# Patient Record
Sex: Female | Born: 1960 | Race: White | Hispanic: No | Smoking: Former smoker
Health system: Southern US, Community
[De-identification: ages and names within clinical notes are randomized; demographics above are authoritative.]

## PROBLEM LIST (undated history)

## (undated) DIAGNOSIS — E785 Hyperlipidemia, unspecified: Secondary | ICD-10-CM

## (undated) DIAGNOSIS — E039 Hypothyroidism, unspecified: Secondary | ICD-10-CM

## (undated) DIAGNOSIS — E669 Obesity, unspecified: Secondary | ICD-10-CM

## (undated) HISTORY — DX: Obesity, unspecified: E66.9

## (undated) HISTORY — DX: Hyperlipidemia, unspecified: E78.5

## (undated) HISTORY — PX: OTHER SURGICAL HISTORY: SHX169

## (undated) HISTORY — PX: APPENDECTOMY: SHX54

## (undated) HISTORY — PX: ABDOMINAL HYSTERECTOMY: SHX81

## (undated) HISTORY — DX: Hypothyroidism, unspecified: E03.9

## (undated) HISTORY — PX: ESOPHAGEAL DILATION: SHX303

## (undated) HISTORY — PX: GANGLION CYST EXCISION: SHX1691

---

## 2005-10-14 ENCOUNTER — Ambulatory Visit: Payer: Self-pay | Admitting: Unknown Physician Specialty

## 2007-08-09 ENCOUNTER — Ambulatory Visit: Payer: Self-pay | Admitting: Unknown Physician Specialty

## 2008-11-22 ENCOUNTER — Ambulatory Visit: Payer: Self-pay | Admitting: Unknown Physician Specialty

## 2010-08-18 ENCOUNTER — Ambulatory Visit: Payer: Self-pay | Admitting: Internal Medicine

## 2010-11-03 ENCOUNTER — Ambulatory Visit (INDEPENDENT_AMBULATORY_CARE_PROVIDER_SITE_OTHER): Payer: Self-pay | Admitting: Internal Medicine

## 2010-11-03 ENCOUNTER — Encounter: Payer: Self-pay | Admitting: Internal Medicine

## 2010-11-03 VITALS — BP 116/80 | HR 84 | Temp 98.1°F | Resp 16 | Ht 62.0 in | Wt 181.0 lb

## 2010-11-03 DIAGNOSIS — F411 Generalized anxiety disorder: Secondary | ICD-10-CM

## 2010-11-03 DIAGNOSIS — J40 Bronchitis, not specified as acute or chronic: Secondary | ICD-10-CM

## 2010-11-03 DIAGNOSIS — E039 Hypothyroidism, unspecified: Secondary | ICD-10-CM | POA: Insufficient documentation

## 2010-11-03 DIAGNOSIS — E663 Overweight: Secondary | ICD-10-CM | POA: Insufficient documentation

## 2010-11-03 DIAGNOSIS — F419 Anxiety disorder, unspecified: Secondary | ICD-10-CM

## 2010-11-03 DIAGNOSIS — F17200 Nicotine dependence, unspecified, uncomplicated: Secondary | ICD-10-CM

## 2010-11-03 MED ORDER — PREDNISONE (PAK) 10 MG PO TABS: 10.0000 mg | ORAL_TABLET | Freq: Every day | ORAL | Status: AC

## 2010-11-03 MED ORDER — AZITHROMYCIN 250 MG PO TABS: ORAL_TABLET | ORAL | Status: AC

## 2010-11-03 NOTE — Progress Notes (Signed)
  Subjective:    Patient ID: Wendy Clarke, female    DOB: Nov 10, 1960, 50 y.o.   MRN: [id number]  URI  This is a new problem. The current episode started in the past 7 days. The problem has been gradually worsening. There has been no fever. Associated symptoms include chest pain, congestion, coughing, a sore throat and wheezing. Pertinent negatives include no abdominal pain, diarrhea, ear pain, headaches, joint pain, joint swelling, nausea, neck pain, plugged ear sensation, rash, rhinorrhea, sinus pain, sneezing or swollen glands. She has tried decongestant for the symptoms. The treatment provided no relief.      Review of Systems  Constitutional: Negative for fever, chills and unexpected weight change.  HENT: Positive for congestion and sore throat. Negative for hearing loss, ear pain, nosebleeds, facial swelling, rhinorrhea, sneezing, mouth sores, trouble swallowing, neck pain, neck stiffness, voice change, postnasal drip, sinus pressure, tinnitus and ear discharge.   Eyes: Negative for pain, discharge, redness and visual disturbance.  Respiratory: Positive for cough and wheezing. Negative for chest tightness, shortness of breath and stridor.   Cardiovascular: Positive for chest pain. Negative for palpitations and leg swelling.  Gastrointestinal: Negative for nausea, abdominal pain and diarrhea.  Musculoskeletal: Negative for myalgias, joint pain and arthralgias.  Skin: Negative for color change and rash.  Neurological: Negative for dizziness, weakness, light-headedness and headaches.  Hematological: Negative for adenopathy.       Objective:   Physical Exam  Constitutional: She is oriented to person, place, and time. She appears well-developed and well-nourished. No distress.  HENT:  Head: Normocephalic and atraumatic.  Right Ear: External ear normal.  Left Ear: External ear normal.  Nose: Nose normal.  Mouth/Throat: Oropharynx is clear and moist. No oropharyngeal exudate.  Eyes:  Conjunctivae and EOM are normal. Pupils are equal, round, and reactive to light. Right eye exhibits no discharge. Left eye exhibits no discharge. No scleral icterus.  Neck: Normal range of motion. Neck supple. No tracheal deviation present. No thyromegaly present.  Cardiovascular: Normal rate and regular rhythm.  Exam reveals gallop. Exam reveals no friction rub.   No murmur heard. Pulmonary/Chest: Effort normal. No stridor. No respiratory distress. She has no wheezes. She has no rales. She exhibits no tenderness.       Prolonged expiratory phase noted  Musculoskeletal: Normal range of motion. She exhibits no edema.  Lymphadenopathy:    She has no cervical adenopathy.  Neurological: She is alert and oriented to person, place, and time. Coordination normal.  Skin: Skin is warm and dry. No rash noted. She is not diaphoretic. No erythema. No pallor.  Psychiatric: She has a normal mood and affect. Her behavior is normal. Judgment and thought content normal.          Assessment:    Acute Bronchitis    Plan:    Discussed diagnosis, its evaluation, treatment and usual course. All questions answered. Agricultural engineer distributed. Steroid burst per orders. Antibiotics per orders. Strongly encouraged smoking cessation.

## 2010-11-03 NOTE — Patient Instructions (Signed)
Smoking Cessation This document explains the best ways for you to quit smoking and new treatments to help. It lists new medicines that can double or triple your chances of quitting and quitting for good. It also considers ways to avoid relapses and concerns you may have about quitting, including weight gain. NICOTINE: A POWERFUL ADDICTION If you have tried to quit smoking, you know how hard it can be. It is hard because nicotine is a very addictive drug. For some people, it can be as addictive as heroin or cocaine. Usually, people make 2 or 3 tries, or more, before finally being able to quit. Each time you try to quit, you can learn about what helps and what hurts. Quitting takes hard work and a lot of effort, but you can quit smoking. QUITTING SMOKING IS ONE OF THE MOST IMPORTANT THINGS YOU WILL EVER DO:  You will live longer, feel better, and live better.   The impact on your body of quitting smoking is felt almost immediately:   Within 20 minutes, blood pressure decreases. Pulse returns to its normal level.   After 8 hours, carbon monoxide levels in the blood return to normal. Oxygen level increases.   After 24 hours, chance of heart attack starts to decrease. Breath, hair, and body stop smelling like smoke.   After 48 hours, damaged nerve endings begin to recover. Sense of taste and smell improve.   After 72 hours, the body is virtually free of nicotine. Bronchial tubes relax and breathing becomes easier.   After 2 to 12 weeks, lungs can hold more air. Exercise becomes easier and circulation improves.   Quitting will lower your chance of having a heart attack, stroke, cancer, or lung disease:   After 1 year, the risk of coronary heart disease is cut in half.   After 5 years, the risk of stroke falls to the same as a nonsmoker.   After 10 years, the risk of lung cancer is cut in half and the risk of other cancers decreases significantly.   After 15 years, the risk of coronary heart  disease drops, usually to the level of a nonsmoker.   If you are pregnant, quitting smoking will improve your chances of having a healthy baby.   The people you live with, especially your children, will be healthier.   You will have extra money to spend on things other than cigarettes.  FIVE KEYS TO QUITTING Studies have shown that these 5 steps will help you quit smoking and quit for good. You have the best chances of quitting if you use them together: 1. Get ready.  2. Get support and encouragement.  3. Learn new skills and behaviors.  4. Get medicine to reduce your nicotine addiction and use it correctly.  5. Be prepared for relapse or difficult situations. Be determined to continue trying to quit, even if you do not succeed at first.  1. GET READY  Set a quit date.   Change your environment.   Get rid of ALL cigarettes, ashtrays, matches, and lighters in your home, car, and place of work.   Do not let people smoke in your home.   Review your past attempts to quit. Think about what worked and what did not.   Once you quit, do not smoke. NOT EVEN A PUFF!  2. GET SUPPORT AND ENCOURAGEMENT Studies have shown that you have a better chance of being successful if you have help. You can get support in many ways.  Tell  your family, friends, and coworkers that you are going to quit and need their support. Ask them not to smoke around you.   Talk to your caregivers (doctor, dentist, nurse, pharmacist, psychologist, and/or smoking counselor).   Get individual, group, or telephone counseling and support. The more counseling you have, the better your chances are of quitting. Programs are available at local hospitals and health centers. Call your local health department for information about programs in your area.   Spiritual beliefs and practices may help some smokers quit.   Quit meters are small computer programs online or downloadable that keep track of quit statistics, such as amount  of "quit-time," cigarettes not smoked, and money saved.   Many smokers find one or more of the many self-help books available useful in helping them quit and stay off tobacco.  3. LEARN NEW SKILLS AND BEHAVIORS  Try to distract yourself from urges to smoke. Talk to someone, go for a walk, or occupy your time with a task.   When you first try to quit, change your routine. Take a different route to work. Drink tea instead of coffee. Eat breakfast in a different place.   Do something to reduce your stress. Take a hot bath, exercise, or read a book.   Plan something enjoyable to do every day. Reward yourself for not smoking.   Explore interactive web-based programs that specialize in helping you quit.  4. GET MEDICINE AND USE IT CORRECTLY Medicines can help you stop smoking and decrease the urge to smoke. Combining medicine with the above behavioral methods and support can quadruple your chances of successfully quitting smoking. The U.[place]. Food and Drug Administration (FDA) has approved 7 medicines to help you quit smoking. These medicines fall into 3 categories.  Nicotine replacement therapy (delivers nicotine to your body without the negative effects and risks of smoking):   Nicotine gum: Available over-the-counter.   Nicotine lozenges: Available over-the-counter.   Nicotine inhaler: Available by prescription.   Nicotine nasal spray: Available by prescription.   Nicotine skin patches (transdermal): Available by prescription and over-the-counter.   Antidepressant medicine (helps people abstain from smoking, but how this works is unknown):   Bupropion sustained-release (SR) tablets: Available by prescription.   Nicotinic receptor partial agonist (simulates the effect of nicotine in your brain):   Varenicline tartrate tablets: Available by prescription.   Ask your caregiver for advice about which medicines to use and how to use them. Carefully read the information on the package.    Everyone who is trying to quit may benefit from using a medicine. If you are pregnant or trying to become pregnant, nursing an infant, you are under age 18, or you smoke fewer than 10 cigarettes per day, talk to your caregiver before taking any nicotine replacement medicines.   You should stop using a nicotine replacement product and call your caregiver if you experience nausea, dizziness, weakness, vomiting, fast or irregular heartbeat, mouth problems with the lozenge or gum, or redness or swelling of the skin around the patch that does not go away.   Do not use any other product containing nicotine while using a nicotine replacement product.   Talk to your caregiver before using these products if you have diabetes, heart disease, asthma, stomach ulcers, you had a recent heart attack, you have high blood pressure that is not controlled with medicine, a history of irregular heartbeat, or you have been prescribed medicine to help you quit smoking.  5. BE PREPARED FOR RELAPSE OR   DIFFICULT SITUATIONS  Most relapses occur within the first 3 months after quitting. Do not be discouraged if you start smoking again. Remember, most people try several times before they finally quit.   You may have symptoms of withdrawal because your body is used to nicotine. You may crave cigarettes, be irritable, feel very hungry, cough often, get headaches, or have difficulty concentrating.   The withdrawal symptoms are only temporary. They are strongest when you first quit, but they will go away within 10 to 14 days.  Here are some difficult situations to watch for:  Alcohol. Avoid drinking alcohol. Drinking lowers your chances of successfully quitting.   Caffeine. Try to reduce the amount of caffeine you consume. It also lowers your chances of successfully quitting.   Other smokers. Being around smoking can make you want to smoke. Avoid smokers.   Weight gain. Many smokers will gain weight when they quit, usually  less than 10 pounds. Eat a healthy diet and stay active. Do not let weight gain distract you from your main goal, quitting smoking. Some medicines that help you quit smoking may also help delay weight gain. You can always lose the weight gained after you quit.   Bad mood or depression. There are a lot of ways to improve your mood other than smoking.  If you are having problems with any of these situations, talk to your caregiver. SPECIAL SITUATIONS OR CONDITIONS Studies suggest that everyone can quit smoking. Your situation or condition can give you a special reason to quit.  Pregnant women/New mothers: By quitting, you protect your baby's health and your own.   Hospitalized patients: By quitting, you reduce health problems and help healing.   Heart attack patients: By quitting, you reduce your risk of a second heart attack.   Lung, head, and neck cancer patients: By quitting, you reduce your chance of a second cancer.   Parents of children and adolescents: By quitting, you protect your children from illnesses caused by secondhand smoke.  QUESTIONS TO THINK ABOUT Think about the following questions before you try to stop smoking. You may want to talk about your answers with your caregiver.  Why do you want to quit?   If you tried to quit in the past, what helped and what did not?   What will be the most difficult situations for you after you quit? How will you plan to handle them?   Who can help you through the tough times? Your family? Friends? Caregiver?   What pleasures do you get from smoking? What ways can you still get pleasure if you quit?  Here are some questions to ask your caregiver:  How can you help me to be successful at quitting?   What medicine do you think would be best for me and how should I take it?   What should I do if I need more help?   What is smoking withdrawal like? How can I get information on withdrawal?  Quitting takes hard work and a lot of effort,  but you can quit smoking. FOR MORE INFORMATION Smokefree.gov (http://www.davis-sullivan.com/) provides free, accurate, evidence-based information and professional assistance to help support the immediate and long-term needs of people trying to quit smoking. Document Released: 02/24/2001 Document Re-Released: 08/20/2009 [facility] Patient Information 2011 [place], [place].Bronchitis Bronchitis is the body's way of reacting to injury and/or infection (inflammation) of the bronchi. Bronchi are the air tubes that extend from the windpipe into the lungs. If the inflammation becomes severe, it may  cause shortness of breath.  CAUSES Inflammation may be caused by:  A virus.   Germs (bacteria).   Dust.   Allergens.   Pollutants and many other irritants.  The cells lining the bronchial tree are covered with tiny hairs (cilia). These constantly beat upward, away from the lungs, toward the mouth. This keeps the lungs free of pollutants. When these cells become too irritated and are unable to do their job, mucus begins to develop. This causes the characteristic cough of bronchitis. The cough clears the lungs when the cilia are unable to do their job. Without either of these protective mechanisms, the mucus would settle in the lungs. Then you would develop pneumonia. Smoking is a common cause of bronchitis and can contribute to pneumonia. Stopping this habit is the single most important thing you can do to help yourself. TREATMENT  Your caregiver may prescribe an antibiotic if the cough is caused by bacteria. Also, medicines that open up your airways make it easier to breathe. Your caregiver may also recommend or prescribe an expectorant. It will loosen the mucus to be coughed up. Only take over-the-counter or prescription medicines for pain, discomfort, or fever as directed by your caregiver.   Removing whatever causes the problem (smoking, for example) is critical to preventing the problem from getting worse.     Cough suppressants may be prescribed for relief of cough symptoms.   Inhaled medicines may be prescribed to help with symptoms now and to help prevent problems from returning.   For those with recurrent (chronic) bronchitis, there may be a need for steroid medicines.  SEEK IMMEDIATE MEDICAL CARE IF:  During treatment, you develop more pus-like mucus (purulent sputum).   You or your child has an oral temperature above 101.62F, not controlled by medicine.  You become progressively more ill.   You have increased difficulty breathing, wheezing, or shortness of breath.  It is necessary to seek immediate medical care if you are elderly or sick from any other disease. MAKE SURE YOU:  Understand these instructions.   Will watch your condition.   Will get help right away if you are not doing well or get worse.  Document Released: 03/02/2005 Document Re-Released: 05/27/2009 [facility] Patient Information 2011 [place], [place].

## 2010-11-04 ENCOUNTER — Encounter: Payer: Self-pay | Admitting: Internal Medicine

## 2011-03-03 ENCOUNTER — Telehealth: Payer: Self-pay | Admitting: Internal Medicine

## 2011-03-03 MED ORDER — ALPRAZOLAM 0.25 MG PO TABS: 0.2500 mg | ORAL_TABLET | Freq: Every evening | ORAL | Status: DC | PRN

## 2011-03-03 NOTE — Telephone Encounter (Signed)
Pt left message on voice mail to get refill on xanax Work # [phone] [employer]

## 2011-03-03 NOTE — Telephone Encounter (Signed)
OK to refill x 3 

## 2011-03-03 NOTE — Telephone Encounter (Signed)
Ok for RF 

## 2011-03-03 NOTE — Telephone Encounter (Signed)
Called in, # disconnected

## 2011-04-27 ENCOUNTER — Telehealth: Payer: Self-pay | Admitting: Internal Medicine

## 2011-04-27 NOTE — Telephone Encounter (Signed)
[facility] Triage Call Report Triage Record Num: [id number] Operator: Caswell Corwin Patient Name: Wendy Clarke Call Date & Time: 04/27/2011 8:30:04AM Patient Phone: [phone] PCP: Ronna Polio Patient Gender: Female PCP Fax : [phone] Patient DOB: 10/13/1960 Practice Name: [facility] Reason for Call: Caller: Wendy Clarke/Patient; PCP: Ronna Polio; CB#: [phone]; ; ; Call regarding Cough/Congestion that started 04/25/11 PM. Triaged URI and all emergent Sx R/O and home care and call back inst given. Protocol(s) Used: Upper Respiratory Infection (URI) Recommended Outcome per Protocol: See Provider within 2 Weeks Reason for Outcome: Recent or recurrent episodes of sneezing, nasal congestion; watery nasal drainage; scratchy/itchy throat; red/itchy/watery eyes or cough unrelieved after one week of home care measures Care Advice: Use an air purifier, clean or change filters on heating/cooling units at least monthly. Maintaining humidity in home below 50%, helps prevent dust mites and mold. ~ ~ Consider use of a saline nasal spray per package directions to help relieve nasal congestion. Avoid exposure to environmental irritants. Do not smoke and avoid second-hand smoke. Avoid outside activities on high pollution days. Instead of strong smelling commercial cleaning products, substitute vinegar and lemon juice. ~ ~ Call provider if symptoms become severe, or if treatment that relieved the symptoms in the past is no longer working. If not contraindicated, consider nonprescription non-sedating antihistamine for symptom relief as directed on label or by pharmacist. Other antihistamine medications may cause drowsiness or confusion, especially in elderly or chronically ill patients and should be taken with caution. ~ Do not take nonprescription decongestants (such as Actifed, Sinutab, Sudafed) if there is a history of hypertension, unless specifically directed by  provider. ~ Be aware that some nonprescription drugs contain both an antihistamine and decongestant. If taking a combination drug, do not take an additional decongestant. ~ 04/27/2011 8:46:37AM Page 1 of 1 [id number]

## 2011-05-18 ENCOUNTER — Ambulatory Visit: Payer: BC Managed Care – PPO | Admitting: Internal Medicine

## 2011-06-04 ENCOUNTER — Other Ambulatory Visit: Payer: Self-pay | Admitting: Internal Medicine

## 2011-06-04 ENCOUNTER — Encounter: Payer: Self-pay | Admitting: Internal Medicine

## 2011-06-04 ENCOUNTER — Ambulatory Visit (INDEPENDENT_AMBULATORY_CARE_PROVIDER_SITE_OTHER): Payer: BC Managed Care – PPO | Admitting: Internal Medicine

## 2011-06-04 VITALS — BP 126/72 | HR 100 | Temp 98.2°F | Resp 16 | Ht 62.0 in | Wt 181.0 lb

## 2011-06-04 DIAGNOSIS — E663 Overweight: Secondary | ICD-10-CM

## 2011-06-04 DIAGNOSIS — E039 Hypothyroidism, unspecified: Secondary | ICD-10-CM

## 2011-06-04 DIAGNOSIS — E785 Hyperlipidemia, unspecified: Secondary | ICD-10-CM

## 2011-06-04 DIAGNOSIS — R5383 Other fatigue: Secondary | ICD-10-CM | POA: Insufficient documentation

## 2011-06-04 DIAGNOSIS — F411 Generalized anxiety disorder: Secondary | ICD-10-CM

## 2011-06-04 DIAGNOSIS — R5381 Other malaise: Secondary | ICD-10-CM

## 2011-06-04 DIAGNOSIS — G4700 Insomnia, unspecified: Secondary | ICD-10-CM | POA: Insufficient documentation

## 2011-06-04 DIAGNOSIS — F419 Anxiety disorder, unspecified: Secondary | ICD-10-CM

## 2011-06-04 MED ORDER — LEVOTHYROXINE SODIUM 50 MCG PO TABS: 50.0000 ug | ORAL_TABLET | Freq: Every day | ORAL | Status: DC

## 2011-06-04 MED ORDER — ALPRAZOLAM 0.25 MG PO TABS: 0.2500 mg | ORAL_TABLET | Freq: Every evening | ORAL | Status: DC | PRN

## 2011-06-04 MED ORDER — PHENTERMINE HCL 37.5 MG PO TABS: 37.5000 mg | ORAL_TABLET | Freq: Every day | ORAL | Status: DC

## 2011-06-04 NOTE — Assessment & Plan Note (Signed)
Pt has been off synthroid x 2 weeks. Will restart today.  Check TSH with labs. Follow up 1 month.

## 2011-06-04 NOTE — Progress Notes (Signed)
Subjective:    Patient ID: Wendy Clarke, female    DOB: 1960-12-14, 51 y.o.   MRN: [id number]  HPI 51 year old female with history of hypothyroidism, obesity, and anxiety/depression presents for followup. She reports that she has recently been out of her medications. She reports significant increase in fatigue. She also notes increase in anxiety related to stress at work. She also notes significant financial stress. She notes that she has recently gained weight. She has not been exercising or following healthy diet.   She is concerned today about insomnia. She has tried taking over-the-counter medication such as Benadryl with no improvement. She does not want to take any medication that has the potential to be habit-forming. She has difficulty falling asleep and staying asleep.  Outpatient Encounter Prescriptions as of 06/04/2011  Medication Sig Dispense Refill  . levothyroxine (SYNTHROID, LEVOTHROID) 50 MCG tablet Take 1 tablet (50 mcg total) by mouth daily.  30 tablet  6  . DISCONTD: ALPRAZolam (XANAX) 0.25 MG tablet Take 1 tablet (0.25 mg total) by mouth at bedtime as needed.  30 tablet  3  . DISCONTD: levothyroxine (SYNTHROID, LEVOTHROID) 50 MCG tablet Take 50 mcg by mouth daily.        . phentermine (ADIPEX-P) 37.5 MG tablet Take 1 tablet (37.5 mg total) by mouth daily before breakfast.  30 tablet  0  . DISCONTD: Levothyroxine Sodium (SYNTHROID PO) Take 1 tablet by mouth daily.        Marland Kitchen DISCONTD: PHENTERMINE HCL PO Take 1 tablet by mouth daily.          Review of Systems  Constitutional: Positive for fatigue. Negative for fever, chills, appetite change and unexpected weight change.  HENT: Negative for ear pain, congestion, sore throat, trouble swallowing, neck pain, voice change and sinus pressure.   Eyes: Negative for visual disturbance.  Respiratory: Negative for cough, shortness of breath, wheezing and stridor.   Cardiovascular: Negative for chest pain, palpitations and leg swelling.    Gastrointestinal: Negative for nausea, abdominal pain, diarrhea, constipation and abdominal distention.  Genitourinary: Negative for dysuria and flank pain.  Musculoskeletal: Negative for myalgias, arthralgias and gait problem.  Skin: Negative for color change and rash.  Neurological: Negative for dizziness and headaches.  Hematological: Negative for adenopathy. Does not bruise/bleed easily.  Psychiatric/Behavioral: Positive for sleep disturbance and dysphoric mood. Negative for suicidal ideas. The patient is nervous/anxious.       BP 126/72  Pulse 100  Temp(Src) 98.2 F (36.8 C) (Oral)  Resp 16  Ht 5\' 2"  (1.575 m)  Wt 181 lb (82.101 kg)  BMI 33.11 kg/m2  SpO2 99%  Objective:   Physical Exam  Constitutional: She is oriented to person, place, and time. She appears well-developed and well-nourished. No distress.  HENT:  Head: Normocephalic and atraumatic.  Right Ear: External ear normal.  Left Ear: External ear normal.  Nose: Nose normal.  Mouth/Throat: Oropharynx is clear and moist. No oropharyngeal exudate.  Eyes: Conjunctivae are normal. Pupils are equal, round, and reactive to light. Right eye exhibits no discharge. Left eye exhibits no discharge. No scleral icterus.  Neck: Normal range of motion. Neck supple. No tracheal deviation present. No thyromegaly present.  Cardiovascular: Normal rate, regular rhythm, normal heart sounds and intact distal pulses.  Exam reveals no gallop and no friction rub.   No murmur heard. Pulmonary/Chest: Effort normal and breath sounds normal. No respiratory distress. She has no wheezes. She has no rales. She exhibits no tenderness.  Abdominal: Soft. Bowel sounds are  normal. She exhibits no distension. There is no tenderness.  Musculoskeletal: Normal range of motion. She exhibits no edema and no tenderness.  Lymphadenopathy:    She has no cervical adenopathy.  Neurological: She is alert and oriented to person, place, and time. No cranial nerve  deficit. She exhibits normal muscle tone. Coordination normal.  Skin: Skin is warm and dry. No rash noted. She is not diaphoretic. No erythema. No pallor.  Psychiatric: Her speech is normal and behavior is normal. Judgment and thought content normal. Her mood appears anxious. Her affect is angry. Cognition and memory are normal. She exhibits a depressed mood.          Assessment & Plan:

## 2011-06-04 NOTE — Assessment & Plan Note (Signed)
Likely multifactorial with stress at work, poor sleep, off thyroid medication.  Encouraged increased physical activity during the day.  Will check CBC, CMP, TSH.

## 2011-06-04 NOTE — Assessment & Plan Note (Signed)
BMI 33.  Will restart phentermine to help with appetite suppression as this has worked well for her in the past. Encouraged improved dietary choices and increased physical activity. Follow up 1 month.

## 2011-06-04 NOTE — Assessment & Plan Note (Signed)
Will try increasing physical activity during the day and using xanax at night prn.  Follow up in 1 month.

## 2011-06-04 NOTE — Assessment & Plan Note (Signed)
Fairly well controlled on Alprazolam. Will continue.

## 2011-06-05 LAB — LIPID PANEL
HDL: 38.6 mg/dL — ABNORMAL LOW (ref >39.00)
VLDL: 110.2 mg/dL — ABNORMAL HIGH (ref 0.0–40.0)

## 2011-06-05 LAB — CBC WITH DIFFERENTIAL/PLATELET
Basophils Relative: 0.6 % (ref 0.0–3.0)
Eosinophils Relative: 3.8 % (ref 0.0–5.0)
Hemoglobin: 14 g/dL (ref 12.0–15.0)
Lymphocytes Relative: 35.8 % (ref 12.0–46.0)
Monocytes Relative: 1.9 % — ABNORMAL LOW (ref 3.0–12.0)
Neutrophils Relative %: 57.9 % (ref 43.0–77.0)
RBC: 4.65 Mil/uL (ref 3.87–5.11)
WBC: 11.4 10*3/uL — ABNORMAL HIGH (ref 4.5–10.5)

## 2011-06-05 LAB — LDL CHOLESTEROL, DIRECT: Direct LDL: 100.1 mg/dL

## 2011-06-05 LAB — COMPREHENSIVE METABOLIC PANEL
ALT: 28 U/L (ref 0–35)
AST: 25 U/L (ref 0–37)
Chloride: 104 mEq/L (ref 96–112)
Creatinine, Ser: 0.5 mg/dL (ref 0.4–1.2)
Sodium: 138 mEq/L (ref 135–145)
Total Bilirubin: 0.4 mg/dL (ref 0.3–1.2)
Total Protein: 7.2 g/dL (ref 6.0–8.3)

## 2011-06-09 ENCOUNTER — Telehealth: Payer: Self-pay | Admitting: *Deleted

## 2011-06-09 NOTE — Telephone Encounter (Signed)
Pt's insurance requires PA on Phentermine 37.5 1 qam #30. Unsure of PA #, faxing form back to pharm.

## 2011-07-21 ENCOUNTER — Encounter: Payer: Self-pay | Admitting: Internal Medicine

## 2011-07-21 ENCOUNTER — Ambulatory Visit (INDEPENDENT_AMBULATORY_CARE_PROVIDER_SITE_OTHER): Payer: BC Managed Care – PPO | Admitting: Internal Medicine

## 2011-07-21 ENCOUNTER — Ambulatory Visit: Payer: Self-pay | Admitting: Internal Medicine

## 2011-07-21 VITALS — BP 104/62 | HR 92 | Temp 98.2°F | Resp 16 | Wt 185.0 lb

## 2011-07-21 DIAGNOSIS — R1011 Right upper quadrant pain: Secondary | ICD-10-CM | POA: Insufficient documentation

## 2011-07-21 LAB — POCT URINALYSIS DIPSTICK
Leukocytes, UA: NEGATIVE
Nitrite, UA: NEGATIVE
Protein, UA: NEGATIVE
pH, UA: 6.5

## 2011-07-21 LAB — CBC WITH DIFFERENTIAL/PLATELET
Basophils Absolute: 0.1 10*3/uL (ref 0.0–0.1)
Eosinophils Absolute: 0.4 10*3/uL (ref 0.0–0.7)
HCT: 41.8 % (ref 36.0–46.0)
Lymphs Abs: 3.2 10*3/uL (ref 0.7–4.0)
MCHC: 34.2 g/dL (ref 30.0–36.0)
MCV: 87.9 fl (ref 78.0–100.0)
Monocytes Absolute: 0.4 10*3/uL (ref 0.1–1.0)
Platelets: 198 10*3/uL (ref 150.0–400.0)
RDW: 13.7 % (ref 11.5–14.6)

## 2011-07-21 LAB — LIPASE: Lipase: 30 U/L (ref 11.0–59.0)

## 2011-07-21 LAB — COMPREHENSIVE METABOLIC PANEL
Albumin: 4 g/dL (ref 3.5–5.2)
Alkaline Phosphatase: 62 U/L (ref 39–117)
Glucose, Bld: 102 mg/dL — ABNORMAL HIGH (ref 70–99)
Potassium: 4 mEq/L (ref 3.5–5.1)
Sodium: 141 mEq/L (ref 135–145)
Total Protein: 7.2 g/dL (ref 6.0–8.3)

## 2011-07-21 NOTE — Progress Notes (Signed)
Subjective:    Patient ID: Wendy Clarke, female    DOB: October 05, 1960, 51 y.o.   MRN: [id number]  HPI 51 year old female with history of anxiety and hypothyroidism presents for acute visit complaining of several week history of intermittent right upper quadrant pain. She reports that pain typically occurs after eating a large meal. The pain is described as sharp and associated with nausea. It resolves without any intervention. She denies any fever or chills. She denies any vomiting. She denies any constipation, but she has had some loose stools. She denies any blood in her stool or black stool.  Outpatient Encounter Prescriptions as of 07/21/2011  Medication Sig Dispense Refill  . ALPRAZolam (XANAX) 0.25 MG tablet Take 1 tablet (0.25 mg total) by mouth at bedtime as needed.  30 tablet  3  . levothyroxine (SYNTHROID, LEVOTHROID) 50 MCG tablet Take 1 tablet (50 mcg total) by mouth daily.  30 tablet  6  . DISCONTD: phentermine (ADIPEX-P) 37.5 MG tablet Take 1 tablet (37.5 mg total) by mouth daily before breakfast.  30 tablet  0    Review of Systems  Constitutional: Negative for fever, chills, appetite change, fatigue and unexpected weight change.  HENT: Negative for ear pain, congestion, sore throat, trouble swallowing, neck pain, voice change and sinus pressure.   Eyes: Negative for visual disturbance.  Respiratory: Negative for cough, shortness of breath, wheezing and stridor.   Cardiovascular: Negative for chest pain, palpitations and leg swelling.  Gastrointestinal: Positive for nausea and abdominal pain. Negative for vomiting, diarrhea, constipation, blood in stool, abdominal distention and anal bleeding.  Genitourinary: Negative for dysuria, urgency, hematuria, flank pain and vaginal pain.  Musculoskeletal: Negative for myalgias, arthralgias and gait problem.  Skin: Negative for color change and rash.  Neurological: Negative for dizziness and headaches.  Hematological: Negative for adenopathy.  Does not bruise/bleed easily.  Psychiatric/Behavioral: Negative for suicidal ideas, sleep disturbance and dysphoric mood. The patient is not nervous/anxious.        Objective:   Physical Exam  Constitutional: She is oriented to person, place, and time. She appears well-developed and well-nourished. No distress.  HENT:  Head: Normocephalic and atraumatic.  Right Ear: External ear normal.  Left Ear: External ear normal.  Nose: Nose normal.  Mouth/Throat: Oropharynx is clear and moist. No oropharyngeal exudate.  Eyes: Conjunctivae are normal. Pupils are equal, round, and reactive to light. Right eye exhibits no discharge. Left eye exhibits no discharge. No scleral icterus.  Neck: Normal range of motion. Neck supple. No tracheal deviation present. No thyromegaly present.  Cardiovascular: Normal rate, regular rhythm, normal heart sounds and intact distal pulses.  Exam reveals no gallop and no friction rub.   No murmur heard. Pulmonary/Chest: Effort normal and breath sounds normal. No respiratory distress. She has no wheezes. She has no rales. She exhibits no tenderness.  Abdominal: Soft. Bowel sounds are normal. There is tenderness (right upper quad) in the right upper quadrant. There is guarding and positive Murphy's sign. There is no rigidity and no rebound.  Musculoskeletal: Normal range of motion. She exhibits no edema and no tenderness.  Lymphadenopathy:    She has no cervical adenopathy.  Neurological: She is alert and oriented to person, place, and time. No cranial nerve deficit. She exhibits normal muscle tone. Coordination normal.  Skin: Skin is warm and dry. No rash noted. She is not diaphoretic. No erythema. No pallor.  Psychiatric: She has a normal mood and affect. Her behavior is normal. Judgment and thought content normal.  Assessment & Plan:

## 2011-07-21 NOTE — Assessment & Plan Note (Signed)
Symptoms and exam are most consistent with cholecystitis. Will check CMP, lipase, CBC, urinalysis. Will send for stat right upper quadrant ultrasound.

## 2011-07-22 ENCOUNTER — Telehealth: Payer: Self-pay | Admitting: *Deleted

## 2011-07-22 NOTE — Telephone Encounter (Signed)
Spoke with patient, she says that she is feeling great today. Not having any abdominal pain or nausea. I advised her to call us if pain persist or if has any nausea.

## 2011-07-27 ENCOUNTER — Encounter: Payer: Self-pay | Admitting: Internal Medicine

## 2011-07-30 ENCOUNTER — Telehealth: Payer: Self-pay | Admitting: Internal Medicine

## 2011-07-30 DIAGNOSIS — R109 Unspecified abdominal pain: Secondary | ICD-10-CM

## 2011-07-30 DIAGNOSIS — K859 Acute pancreatitis without necrosis or infection, unspecified: Secondary | ICD-10-CM

## 2011-07-30 NOTE — Telephone Encounter (Signed)
[phone] Pt calledto let you know that her pain is back .  Pain upper right side of abd and back. Please advise pt on what to do Pt refused to talk traige nurse

## 2011-07-30 NOTE — Telephone Encounter (Signed)
Needs appt to be seen. 

## 2011-07-31 NOTE — Telephone Encounter (Signed)
Spoke w/patient concerning drug allergy before sending Rx; patient does not want pain medication. Pt does not want to come back in to office.  After further questioning to clarify, pt stated that she was told at last OV that if pain returned [after all previous test were normal] that next step would be CT scan. Please advise on order for CT scan.

## 2011-07-31 NOTE — Telephone Encounter (Signed)
Patient refused to make appt, she says that she was just seen last week and was told to call back and let you know if pain continued.

## 2011-07-31 NOTE — Telephone Encounter (Signed)
Imaging was normal.  We can call in Vicodin 5-500mg  1 po tid prn pain #60 if that will help. Please confirm no allergy.

## 2011-07-31 NOTE — Telephone Encounter (Signed)
Left message asking patient to return call. 

## 2011-07-31 NOTE — Telephone Encounter (Signed)
OK. US showed chronic pancreatitis. She will need repeat lipase, CMP, and CT abdomen with contrast.  She will also need GI referral.

## 2011-08-03 NOTE — Telephone Encounter (Signed)
Left another message asking patient to call the office.  Jasmine December if you get a chance, could you try calling her again while I am out. Thanks.

## 2011-08-04 NOTE — Telephone Encounter (Signed)
Pancreatitis was seen by [place]. CT will provide more thorough evaluation of her abdominal pain and help assess as to cause of pancreatitis.

## 2011-08-04 NOTE — Telephone Encounter (Signed)
CT is scheduled for Thursday at 8:15am; repeat labs at 10:00am 05.23.13 [as pt works in [facility] does not want to make 2 trips]. Do not see anything on GI referral; please advise. Also, patient would like more clarity as to why we are repeating & doing new test, she was under the impression that pancreatitis was mild; please advise/SLS

## 2011-08-05 NOTE — Telephone Encounter (Signed)
Dr. Dan Humphreys will you please put in the GI referral.

## 2011-08-06 ENCOUNTER — Other Ambulatory Visit (INDEPENDENT_AMBULATORY_CARE_PROVIDER_SITE_OTHER): Payer: BC Managed Care – PPO

## 2011-08-06 ENCOUNTER — Ambulatory Visit: Payer: Self-pay | Admitting: Internal Medicine

## 2011-08-06 DIAGNOSIS — R1011 Right upper quadrant pain: Secondary | ICD-10-CM

## 2011-08-06 LAB — LIPASE: Lipase: 32 U/L (ref 11.0–59.0)

## 2011-08-06 LAB — COMPREHENSIVE METABOLIC PANEL
AST: 29 U/L (ref 0–37)
Albumin: 3.9 g/dL (ref 3.5–5.2)
Alkaline Phosphatase: 62 U/L (ref 39–117)
Potassium: 4.3 mEq/L (ref 3.5–5.1)
Sodium: 136 mEq/L (ref 135–145)
Total Bilirubin: 0.4 mg/dL (ref 0.3–1.2)
Total Protein: 7 g/dL (ref 6.0–8.3)

## 2011-08-07 ENCOUNTER — Telehealth: Payer: Self-pay | Admitting: Internal Medicine

## 2011-08-07 NOTE — Telephone Encounter (Signed)
The CT Dr Dan Humphreys ordered on her patient to rule out pancreatitis  was normal. F/u with Dr. Dan Humphreys as needed.

## 2011-08-07 NOTE — Telephone Encounter (Signed)
LMOM to inform patient with contact name & number/SLS

## 2011-08-18 ENCOUNTER — Encounter: Payer: Self-pay | Admitting: Internal Medicine

## 2011-09-22 ENCOUNTER — Ambulatory Visit: Payer: Self-pay | Admitting: Internal Medicine

## 2011-09-23 ENCOUNTER — Telehealth: Payer: Self-pay | Admitting: Internal Medicine

## 2011-09-23 NOTE — Telephone Encounter (Signed)
Caller: [name]/Patient; PCP: Ronna Polio; CB#: [phone];   Call regarding "Discomfort in my private parts" not in vaginal area, but each side of vaginal area, on top of both inner thighs. [place] the beach.   Denies vaginal discharge, blisters, swelling,  or itching.   Is reddened and "irritated."   Skin is peeling at areas.  Afebrile.  Is very uncomfortable wearing panties.  Has used Hydrocortisone cream without relief.  Emergent sx negative.  Care advice per "Genital Lesions, Female" with appt advised within 4h due to "new s/s of local infection."  No available appts per EPIC on 07/10.  Recommended trial of Monistat external cream TID to area. OFFICE PLEASE CALL Wendy Clarke AND ADVISE IF YOU CAN SCHEDULE APPT, OR IF SHE NEEDS TO GO TO [facility].

## 2011-10-16 ENCOUNTER — Encounter: Payer: Self-pay | Admitting: Internal Medicine

## 2011-10-19 ENCOUNTER — Encounter: Payer: Self-pay | Admitting: Internal Medicine

## 2011-11-13 LAB — HM MAMMOGRAPHY: HM Mammogram: NORMAL

## 2011-12-04 ENCOUNTER — Other Ambulatory Visit: Payer: Self-pay | Admitting: Internal Medicine

## 2011-12-04 MED ORDER — ALPRAZOLAM 0.25 MG PO TABS: 0.2500 mg | ORAL_TABLET | Freq: Every evening | ORAL | Status: DC | PRN

## 2011-12-04 NOTE — Telephone Encounter (Signed)
Is it ok to send in zostavax vaccine to pharmacy?

## 2011-12-04 NOTE — Telephone Encounter (Signed)
[facility] she would like a prescription for the shingles vaccine and her generic medication for xananx.

## 2011-12-07 ENCOUNTER — Other Ambulatory Visit: Payer: Self-pay | Admitting: *Deleted

## 2011-12-07 MED ORDER — ZOSTER VACCINE LIVE 19400 UNT/0.65ML ~~LOC~~ SOLR: 0.6500 mL | Freq: Once | SUBCUTANEOUS | Status: DC

## 2011-12-07 NOTE — Telephone Encounter (Signed)
Rx called to [facility] pharmacy. 

## 2011-12-31 ENCOUNTER — Other Ambulatory Visit: Payer: Self-pay | Admitting: *Deleted

## 2011-12-31 DIAGNOSIS — E039 Hypothyroidism, unspecified: Secondary | ICD-10-CM

## 2011-12-31 MED ORDER — LEVOTHYROXINE SODIUM 50 MCG PO TABS: 50.0000 ug | ORAL_TABLET | Freq: Every day | ORAL | Status: DC

## 2011-12-31 NOTE — Telephone Encounter (Signed)
R'cd fax from [facility] for refill of Levothyroxine .

## 2012-01-12 ENCOUNTER — Telehealth: Payer: Self-pay | Admitting: Internal Medicine

## 2012-01-12 NOTE — Telephone Encounter (Signed)
Caller: [name]/Patient; Patient Name: Wendy Clarke; PCP: Ronna Polio (Adults only); Best Callback Phone Number: [phone].  Pain in genital area with draining noted. Afebrile/subjective.   Onset approx. 3 weeks.  Pain 0 with standing; up to 8 of 10 with movement or sitting. Advised see provider within2 4 hours per Genital Lesions - female protocol.  States she is not able to come from [place] due to work; she requests appointment on 01/13/12.  Appointment with Dr. Dan Humphreys at 1500 on 01/13/12.  Home care for the interim.

## 2012-01-13 ENCOUNTER — Ambulatory Visit (INDEPENDENT_AMBULATORY_CARE_PROVIDER_SITE_OTHER): Payer: BC Managed Care – PPO | Admitting: Internal Medicine

## 2012-01-13 ENCOUNTER — Encounter: Payer: Self-pay | Admitting: Internal Medicine

## 2012-01-13 VITALS — BP 130/100 | HR 97 | Temp 98.7°F | Ht 62.0 in | Wt 192.4 lb

## 2012-01-13 DIAGNOSIS — N766 Ulceration of vulva: Secondary | ICD-10-CM | POA: Insufficient documentation

## 2012-01-13 DIAGNOSIS — N9489 Other specified conditions associated with female genital organs and menstrual cycle: Secondary | ICD-10-CM

## 2012-01-13 MED ORDER — VALACYCLOVIR HCL 1 G PO TABS: 1000.0000 mg | ORAL_TABLET | Freq: Two times a day (BID) | ORAL | Status: DC

## 2012-01-13 MED ORDER — SULFAMETHOXAZOLE-TRIMETHOPRIM 400-80 MG PO TABS: 1.0000 | ORAL_TABLET | Freq: Two times a day (BID) | ORAL | Status: DC

## 2012-01-13 NOTE — Progress Notes (Signed)
  Subjective:    Patient ID: Wendy Clarke, female    DOB: 1961/02/25, 51 y.o.   MRN: [id number]  HPI 51 year old female presents for acute visit complaining of a three-week history of genital sore on the left labia. She first noticed sore approximately 3 weeks ago. She reports that the area is extremely tender to touch. It has drained a small amount of clear fluid. She denies any vaginal discharge. She does have one new sexual partner. She denies any pelvic pain, dysuria however she does note that is extremely painful when she urinates because of external genitalia pain. She denies any fever or chills.  Outpatient Encounter Prescriptions as of 01/13/2012  Medication Sig Dispense Refill  . ALPRAZolam (XANAX) 0.25 MG tablet Take 1 tablet (0.25 mg total) by mouth at bedtime as needed.  30 tablet  3  . levothyroxine (SYNTHROID, LEVOTHROID) 50 MCG tablet Take 1 tablet (50 mcg total) by mouth daily.  30 tablet  6   BP 130/100  Pulse 97  Temp 98.7 F (37.1 C) (Oral)  Ht 5\' 2"  (1.575 m)  Wt 192 lb 6 oz (87.261 kg)  BMI 35.19 kg/m2  SpO2 97%  Review of Systems  Constitutional: Negative for fever, chills, appetite change, fatigue and unexpected weight change.  HENT: Negative for ear pain, congestion, sore throat, trouble swallowing, neck pain, voice change and sinus pressure.   Eyes: Negative for visual disturbance.  Respiratory: Negative for cough, shortness of breath and stridor.   Gastrointestinal: Negative for nausea, vomiting, abdominal pain, diarrhea, constipation, blood in stool, abdominal distention and anal bleeding.  Genitourinary: Positive for genital sores. Negative for dysuria, urgency, frequency, flank pain, vaginal bleeding, vaginal discharge, pelvic pain and dyspareunia.  Musculoskeletal: Negative for myalgias, arthralgias and gait problem.  Skin: Negative for color change and rash.  Neurological: Negative for dizziness and headaches.  Hematological: Negative for adenopathy. Does  not bruise/bleed easily.  Psychiatric/Behavioral: Negative for suicidal ideas, disturbed wake/sleep cycle and dysphoric mood. The patient is not nervous/anxious.        Objective:   Physical Exam  Constitutional: She appears well-developed and well-nourished. No distress.  HENT:  Head: Normocephalic.  Neck: Normal range of motion.  Pulmonary/Chest: Effort normal.  Abdominal: Soft.  Genitourinary: Vagina normal.    There is tenderness on the right labia. There is no rash on the right labia. There is tenderness and lesion on the left labia. There is no rash on the left labia. No vaginal discharge found.  Skin: She is not diaphoretic.          Assessment & Plan:

## 2012-01-13 NOTE — Assessment & Plan Note (Signed)
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 Culture for herpes sent today. Will also send bacterial culture. Will cover with Valtrex and Bactrim. Patient will call if symptoms are not improving.

## 2012-01-14 ENCOUNTER — Other Ambulatory Visit: Payer: Self-pay | Admitting: Internal Medicine

## 2012-01-17 LAB — WOUND CULTURE
Gram Stain: NONE SEEN
Gram Stain: NONE SEEN
Gram Stain: NONE SEEN

## 2012-01-19 ENCOUNTER — Encounter: Payer: Self-pay | Admitting: Internal Medicine

## 2012-01-19 LAB — HSV 2 ANTIBODY, IGG: HSV II IgG,Type Spec: 3.44 index — ABNORMAL HIGH (ref 0.00–0.90)

## 2012-01-19 LAB — HSV NON-SPECIFIC ANTIBODY, IGM: HSVI/II Comb IgM: <0.91 ratio (ref 0.00–0.90)

## 2012-01-21 ENCOUNTER — Encounter: Payer: Self-pay | Admitting: Internal Medicine

## 2012-01-22 ENCOUNTER — Encounter: Payer: Self-pay | Admitting: *Deleted

## 2012-03-18 ENCOUNTER — Telehealth: Payer: Self-pay | Admitting: Internal Medicine

## 2012-03-18 NOTE — Telephone Encounter (Signed)
Patient Information:  Caller Name: Colton  Phone: [phone]  Patient: Wendy Clarke, Wendy Clarke  Gender: Female  DOB: 09-07-1960  Age: 52 Years  PCP: Ronna Polio (Adults only)  Pregnant: No  Office Follow Up:  Does the office need to follow up with this patient?: No  Instructions For The Office: N/A   Symptoms  Reason For Call & Symptoms: Onset of illness Sunday, 03/13/12.  She states she is currently having Chest congestion and cough.  Productive- Green Yellow.  Reviewed Health History In EMR: Yes  Reviewed Medications In EMR: Yes  Reviewed Allergies In EMR: Yes  Reviewed Surgeries / Procedures: No  Date of Onset of Symptoms: 03/13/2012  Treatments Tried: Tylenol, Nightime cold/Nyquil  Treatments Tried Worked: No OB / GYN:  LMP: Unknown  Guideline(s) Used:  Cough  Disposition Per Guideline:   Home Care  Reason For Disposition Reached:   Cough with cold symptoms (e.g., runny nose, postnasal drip, throat clearing)  Advice Given:  Reassurance  Coughing is the way that our lungs remove irritants and mucus. It helps protect our lungs from getting pneumonia.  You can get a dry hacking cough after a chest cold. Sometimes this type of cough can last 1-3 weeks, and be worse at night.  You can also get a cough after being exposed to irritating substances like smoke, strong perfumes, and dust.  Here is some care advice that should help.  Cough Medicines:  OTC Cough Syrups: The most common cough suppressant in OTC cough medications is dextromethorphan. Often the letters "DM" appear in the name.  OTC Cough Drops: Cough drops can help a lot, especially for mild coughs. They reduce coughing by soothing your irritated throat and removing that tickle sensation in the back of the throat. Cough drops also have the advantage of portability - you can carry them with you.  Home Remedy - Hard Candy: Hard candy works just as well as medicine-flavored OTC cough drops. Diabetics should use  sugar-free candy.  Home Remedy - Honey: This old home remedy has been shown to help decrease coughing at night. The adult dosage is 2 teaspoons (10 ml) at bedtime. Honey should not be given to infants under one year of age.  OTC Cough Syrup - Dextromethorphan:  Cough syrups containing the cough suppressant dextromethorphan (DM) may help decrease your cough. Cough syrups work best for coughs that keep you awake at night. They can also sometimes help in the late stages of a respiratory infection when the cough is dry and hacking. They can be used along with cough drops.  Examples: Benylin, Robitussin DM, Vicks 44 Cough Relief  Read the package instructions for dosage, contraindications, and other important information.  Coughing Spasms:  Drink warm fluids. Inhale warm mist (Reason: both relax the airway and loosen up the phlegm).  Suck on cough drops or hard candy to coat the irritated throat.  Prevent Dehydration:  Drink adequate liquids.  This will help soothe an irritated or dry throat and loosen up the phlegm.  Expected Course:   The expected course depends on what is causing the cough.  Viral bronchitis (chest cold) causes a cough that lasts 1 to 3 weeks. Sometimes you may cough up lots of phlegm (sputum, mucus). The mucus can normally be white, gray, yellow, or green.  Call Back If:  Difficulty breathing  Cough lasts more than 3 weeks  Fever lasts > 3 days  You become worse.

## 2012-03-28 ENCOUNTER — Encounter: Payer: Self-pay | Admitting: Internal Medicine

## 2012-03-28 ENCOUNTER — Other Ambulatory Visit: Payer: Self-pay | Admitting: Internal Medicine

## 2012-03-28 MED ORDER — ALPRAZOLAM 0.25 MG PO TABS: 0.2500 mg | ORAL_TABLET | Freq: Every evening | ORAL | Status: DC | PRN

## 2012-03-28 NOTE — Telephone Encounter (Signed)
ALPRAZolam (XANAX) 0.25 MG tablet   #30

## 2012-03-28 NOTE — Telephone Encounter (Signed)
Rx called to [facility] pharmacy. 

## 2012-04-30 ENCOUNTER — Other Ambulatory Visit: Payer: Self-pay

## 2012-05-12 ENCOUNTER — Telehealth: Payer: Self-pay | Admitting: Internal Medicine

## 2012-05-24 IMAGING — US US ABDOMEN LIMITED SLG ORGAN/ASCITES
1 series · 17 of 25 positions shown · non-contrast
Comparison: none

REASON FOR EXAM: [id number]  RUQ pain   Gallbladder
COMMENTS:

[Series 1: us abdomen limited slg organ/ascites · 17 of 33 slices shown]
[im 1/33]
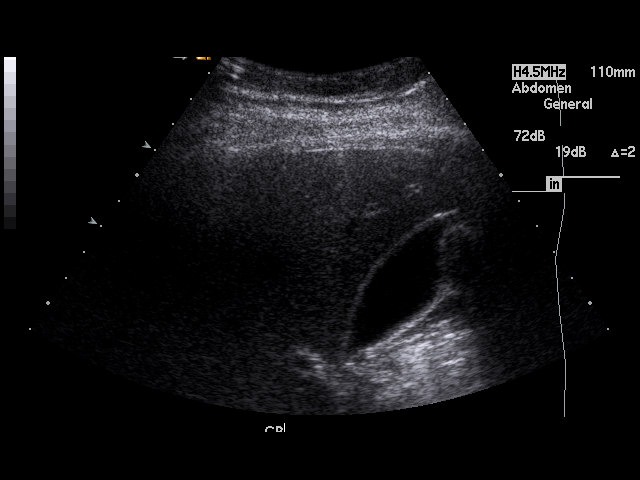
[im 3/33]
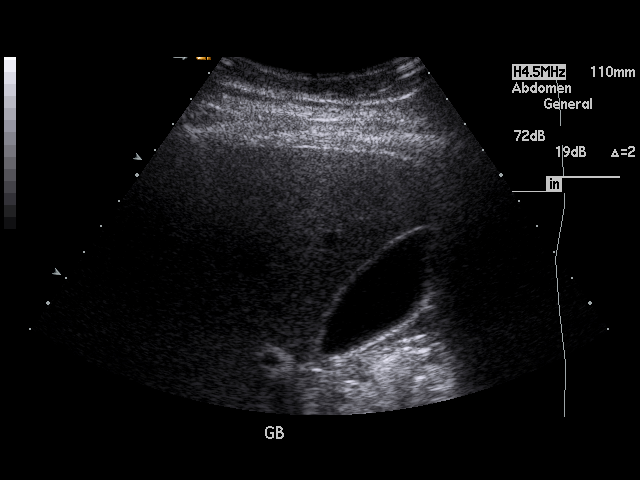
[im 5/33]
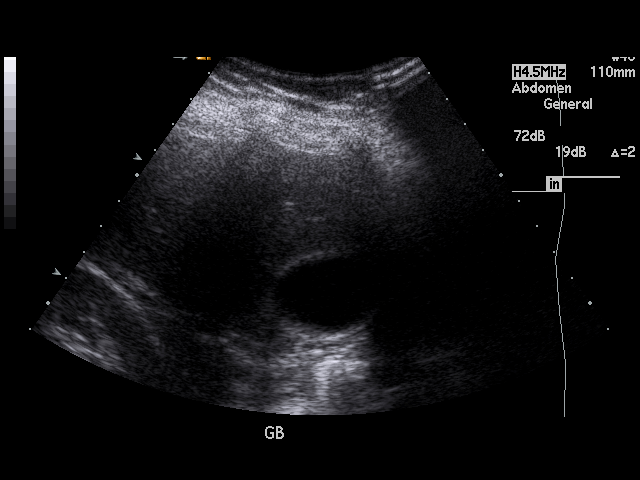
[im 7/33]
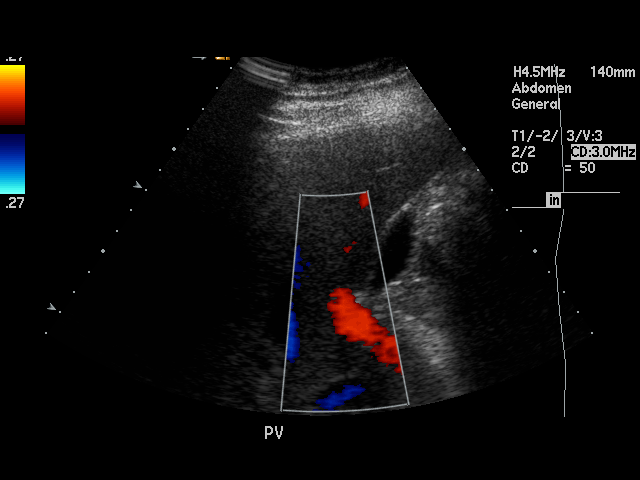
[im 9/33]
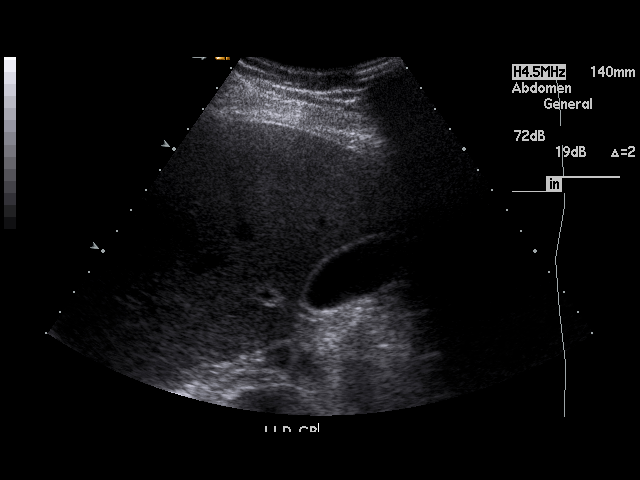
[im 11/33]
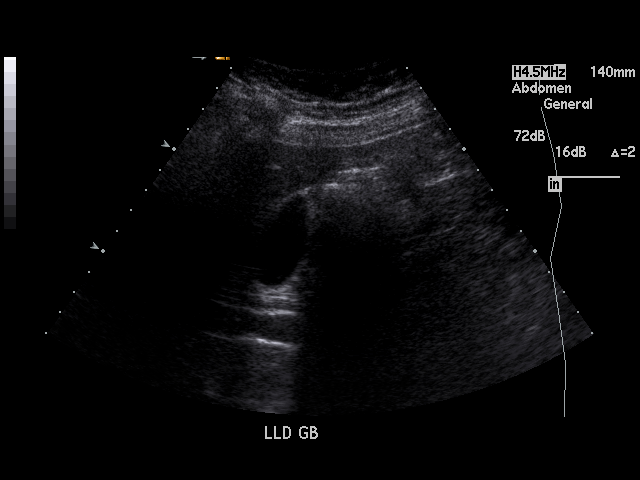
[im 13/33]
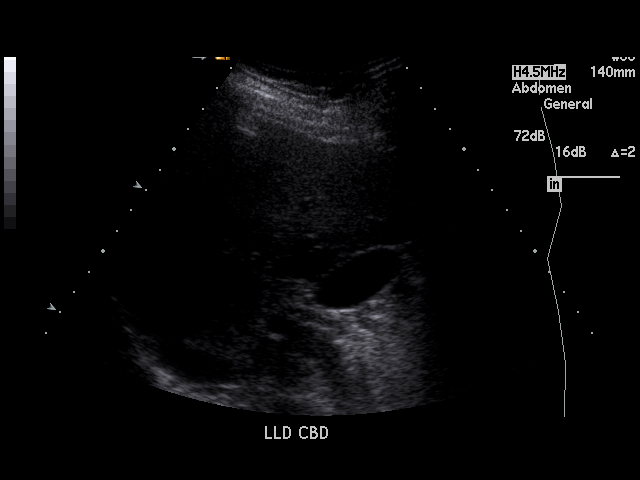
[im 15/33]
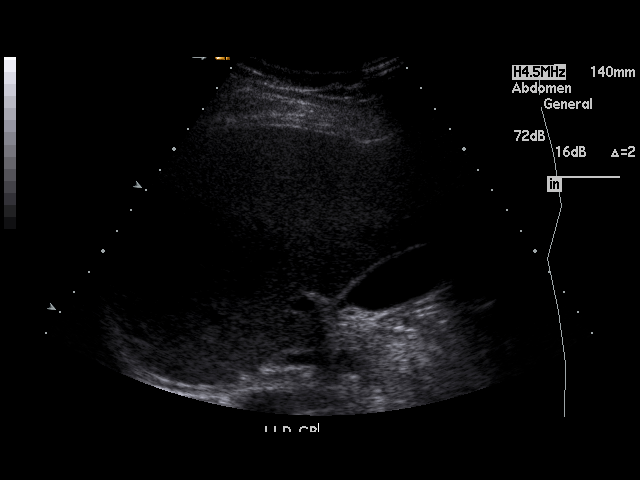
[im 17/33]
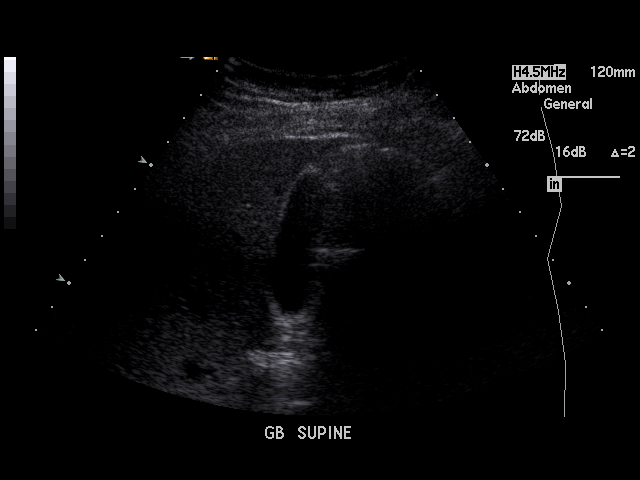
[im 18/33]
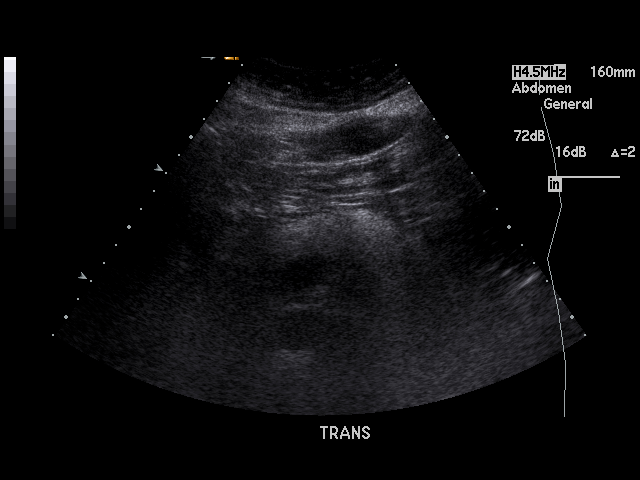
[im 21/33]
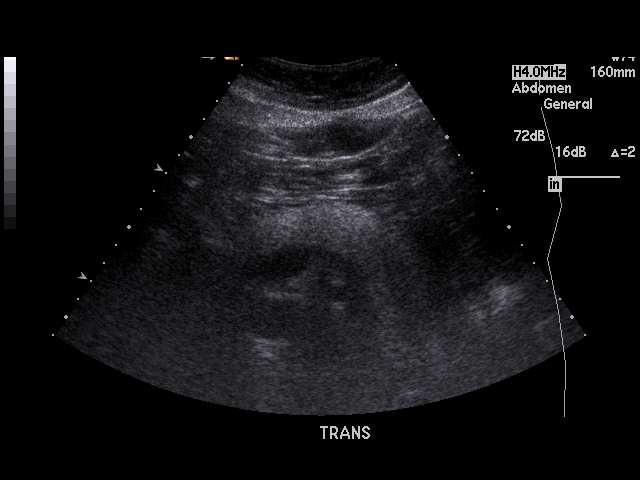
[im 22/33]
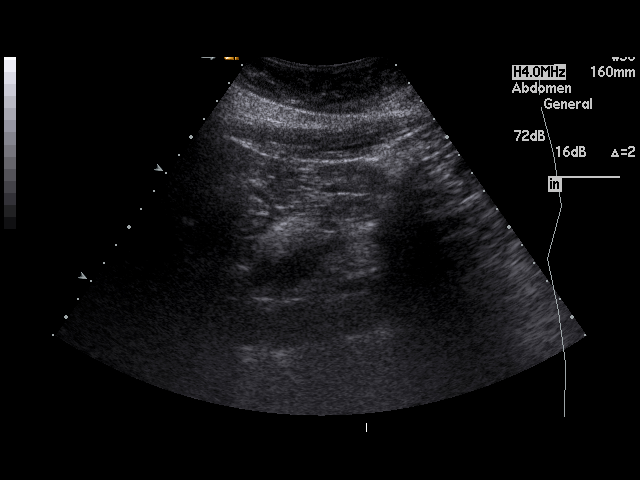
[im 25/33]
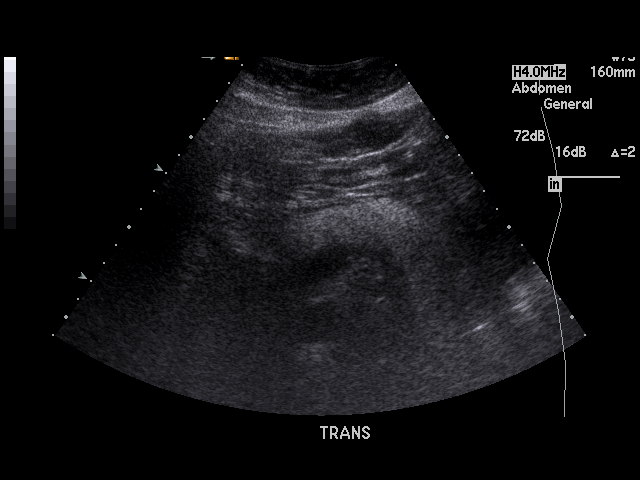
[im 26/33]
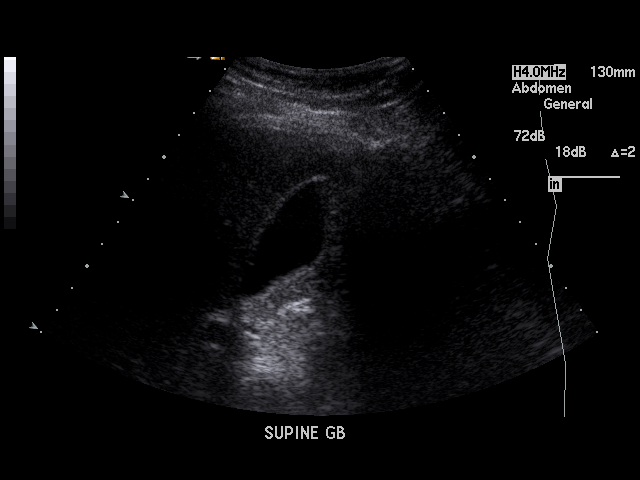
[im 29/33]
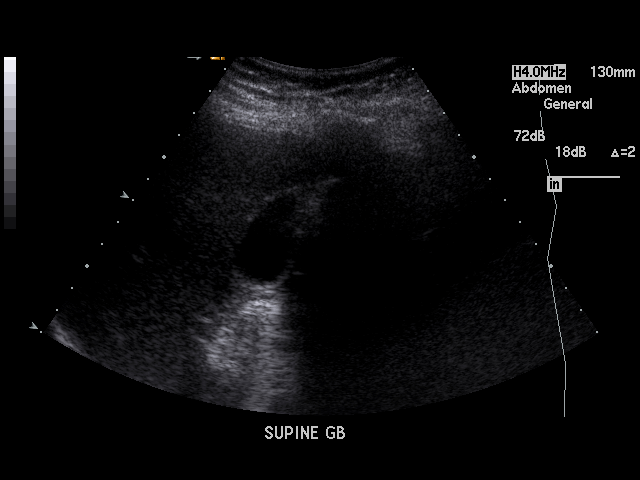
[im 30/33]
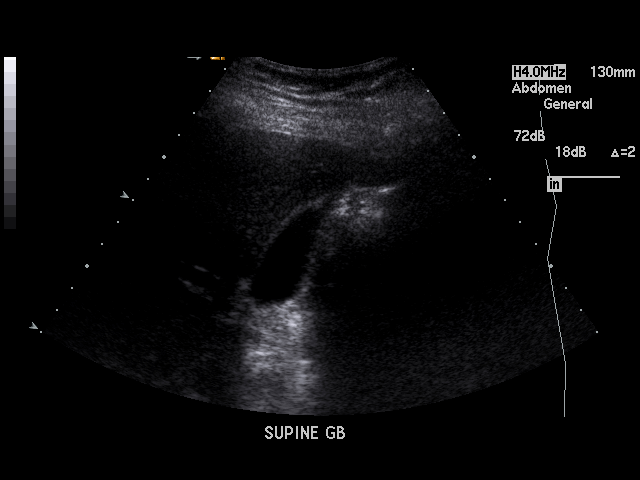
[im 33/33]
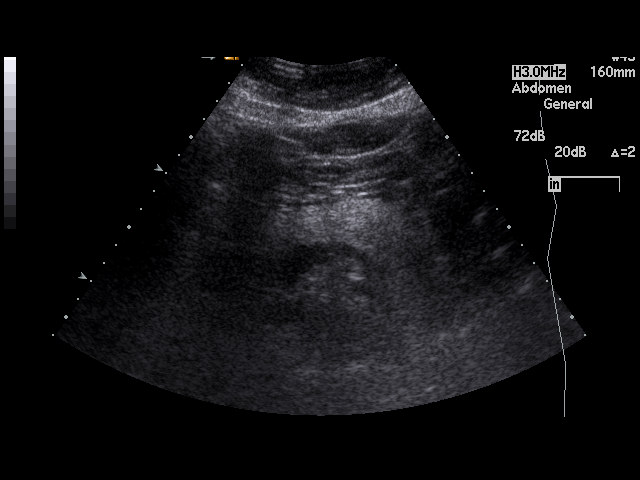

[17 of 25 positions shown; findings below may reference images not displayed]

PROCEDURE:     SHILO - SHILO ABDOMEN LTD 1 ORGAN OR QUAD  - July 21, 2011  [DATE]

RESULT:     The gallbladder is normal. Negative Murphy's sign noted.
Gallbladder wall thickness is 2.9 mm. The common bile duct caliber is 4 mm.
Mild fullness and increased echogenicity noted about the pancreas. Mild
pancreatitis cannot be excluded.
IMPRESSION: Cannot exclude mild pancreatitis.

## 2012-07-13 ENCOUNTER — Ambulatory Visit (INDEPENDENT_AMBULATORY_CARE_PROVIDER_SITE_OTHER): Payer: BC Managed Care – PPO | Admitting: Internal Medicine

## 2012-07-13 ENCOUNTER — Encounter: Payer: Self-pay | Admitting: Internal Medicine

## 2012-07-13 VITALS — BP 130/96 | HR 88 | Temp 98.5°F | Wt 199.0 lb

## 2012-07-13 DIAGNOSIS — F411 Generalized anxiety disorder: Secondary | ICD-10-CM

## 2012-07-13 DIAGNOSIS — E039 Hypothyroidism, unspecified: Secondary | ICD-10-CM

## 2012-07-13 DIAGNOSIS — F419 Anxiety disorder, unspecified: Secondary | ICD-10-CM

## 2012-07-13 DIAGNOSIS — Z0000 Encounter for general adult medical examination without abnormal findings: Secondary | ICD-10-CM

## 2012-07-13 LAB — COMPREHENSIVE METABOLIC PANEL
BUN: 7 mg/dL (ref 6–23)
CO2: 26 mEq/L (ref 19–32)
Creatinine, Ser: 0.5 mg/dL (ref 0.4–1.2)
GFR: 139.82 mL/min (ref >60.00)
Glucose, Bld: 94 mg/dL (ref 70–99)
Sodium: 139 mEq/L (ref 135–145)
Total Bilirubin: 0.5 mg/dL (ref 0.3–1.2)
Total Protein: 7.3 g/dL (ref 6.0–8.3)

## 2012-07-13 LAB — LIPID PANEL
Cholesterol: 228 mg/dL — ABNORMAL HIGH (ref 0–200)
HDL: 32.4 mg/dL — ABNORMAL LOW (ref >39.00)
Triglycerides: 589 mg/dL — ABNORMAL HIGH (ref 0.0–149.0)

## 2012-07-13 MED ORDER — ALPRAZOLAM 0.25 MG PO TABS: 0.2500 mg | ORAL_TABLET | Freq: Three times a day (TID) | ORAL | Status: DC | PRN

## 2012-07-13 MED ORDER — LEVOTHYROXINE SODIUM 50 MCG PO TABS: 50.0000 ug | ORAL_TABLET | Freq: Every day | ORAL | Status: DC

## 2012-07-13 NOTE — Assessment & Plan Note (Signed)
Will check TSH with labs today. Continue Levothyroxine. 

## 2012-07-13 NOTE — Progress Notes (Signed)
  Subjective:    Patient ID: Wendy Clarke, female    DOB: 02/18/1961, 52 y.o.   MRN: [id number]  HPI 52 year old female with history of anxiety and hypothyroidism presents for followup. She reports she is doing well. She recently started dating a new person and reports that things are going well. Symptoms of anxiety have improved. She denies any new concerns today.  Outpatient Encounter Prescriptions as of 07/13/2012  Medication Sig Dispense Refill  . ALPRAZolam (XANAX) 0.25 MG tablet Take 1 tablet (0.25 mg total) by mouth 3 (three) times daily as needed.  90 tablet  3  . levothyroxine (SYNTHROID, LEVOTHROID) 50 MCG tablet Take 1 tablet (50 mcg total) by mouth daily.  90 tablet  4   No facility-administered encounter medications on file as of 07/13/2012.   BP 130/96  Pulse 88  Temp(Src) 98.5 F (36.9 C) (Oral)  Wt 199 lb (90.266 kg)  BMI 36.39 kg/m2  SpO2 95%  Review of Systems  Constitutional: Negative for fever, chills, appetite change, fatigue and unexpected weight change.  HENT: Negative for ear pain, congestion, sore throat, trouble swallowing, neck pain, voice change and sinus pressure.   Eyes: Negative for visual disturbance.  Respiratory: Negative for cough, shortness of breath, wheezing and stridor.   Cardiovascular: Negative for chest pain, palpitations and leg swelling.  Gastrointestinal: Negative for nausea, vomiting, abdominal pain, diarrhea, constipation, blood in stool, abdominal distention and anal bleeding.  Genitourinary: Negative for dysuria and flank pain.  Musculoskeletal: Negative for myalgias, arthralgias and gait problem.  Skin: Negative for color change and rash.  Neurological: Negative for dizziness and headaches.  Hematological: Negative for adenopathy. Does not bruise/bleed easily.  Psychiatric/Behavioral: Negative for suicidal ideas, sleep disturbance and dysphoric mood. The patient is not nervous/anxious.        Objective:   Physical Exam   Constitutional: She is oriented to person, place, and time. She appears well-developed and well-nourished. No distress.  HENT:  Head: Normocephalic and atraumatic.  Right Ear: External ear normal.  Left Ear: External ear normal.  Nose: Nose normal.  Mouth/Throat: Oropharynx is clear and moist. No oropharyngeal exudate.  Eyes: Conjunctivae are normal. Pupils are equal, round, and reactive to light. Right eye exhibits no discharge. Left eye exhibits no discharge. No scleral icterus.  Neck: Normal range of motion. Neck supple. No tracheal deviation present. No thyromegaly present.  Cardiovascular: Normal rate, regular rhythm, normal heart sounds and intact distal pulses.  Exam reveals no gallop and no friction rub.   No murmur heard. Pulmonary/Chest: Effort normal and breath sounds normal. No accessory muscle usage. Not tachypneic. No respiratory distress. She has no decreased breath sounds. She has no wheezes. She has no rhonchi. She has no rales. She exhibits no tenderness.  Musculoskeletal: Normal range of motion. She exhibits no edema and no tenderness.  Lymphadenopathy:    She has no cervical adenopathy.  Neurological: She is alert and oriented to person, place, and time. No cranial nerve deficit. She exhibits normal muscle tone. Coordination normal.  Skin: Skin is warm and dry. No rash noted. She is not diaphoretic. No erythema. No pallor.  Psychiatric: She has a normal mood and affect. Her behavior is normal. Judgment and thought content normal.          Assessment & Plan:

## 2012-07-13 NOTE — Assessment & Plan Note (Signed)
Symptoms well controlled with prn alprazolam. Will continue. 

## 2012-07-18 ENCOUNTER — Encounter: Payer: Self-pay | Admitting: Internal Medicine

## 2012-08-05 ENCOUNTER — Encounter: Payer: Self-pay | Admitting: Internal Medicine

## 2012-08-12 ENCOUNTER — Encounter: Payer: BC Managed Care – PPO | Admitting: Internal Medicine

## 2012-09-06 ENCOUNTER — Telehealth: Payer: Self-pay | Admitting: Internal Medicine

## 2012-09-06 ENCOUNTER — Encounter: Payer: Self-pay | Admitting: Internal Medicine

## 2012-09-06 NOTE — Telephone Encounter (Signed)
Can we set her up with Wendy Clarke or with me next week?

## 2012-09-06 NOTE — Telephone Encounter (Signed)
See prev note

## 2012-09-06 NOTE — Telephone Encounter (Signed)
Dr. Dan Humphreys wants this patient seen in order to evaluate her before sending her out to a GI doctor . I don't have anytime to schedule this patient . Please advise.

## 2012-09-07 ENCOUNTER — Ambulatory Visit: Payer: BC Managed Care – PPO | Admitting: Internal Medicine

## 2012-09-07 NOTE — Telephone Encounter (Signed)
Called patient to make appointment for next week.  Patient stated she didn't not want appointment.  She stated her symptoms are gone and the next time it happens she will go to er

## 2012-09-08 ENCOUNTER — Ambulatory Visit: Payer: BC Managed Care – PPO | Admitting: Adult Health

## 2012-11-29 ENCOUNTER — Ambulatory Visit: Payer: Self-pay | Admitting: Internal Medicine

## 2012-11-29 LAB — HM MAMMOGRAPHY

## 2012-12-02 LAB — HM MAMMOGRAPHY

## 2012-12-12 ENCOUNTER — Encounter: Payer: Self-pay | Admitting: Internal Medicine

## 2013-01-19 ENCOUNTER — Other Ambulatory Visit: Payer: Self-pay

## 2013-02-13 ENCOUNTER — Encounter: Payer: Self-pay | Admitting: Internal Medicine

## 2013-02-13 ENCOUNTER — Encounter: Payer: Self-pay | Admitting: *Deleted

## 2013-02-13 DIAGNOSIS — F411 Generalized anxiety disorder: Secondary | ICD-10-CM

## 2013-02-13 NOTE — Telephone Encounter (Signed)
Last visit 07/13/12, was supposed to return for physical in October, no future appointments scheduled, refill or appointment first?

## 2013-02-13 NOTE — Telephone Encounter (Signed)
Needs visit prior to refill.

## 2013-02-17 ENCOUNTER — Ambulatory Visit (INDEPENDENT_AMBULATORY_CARE_PROVIDER_SITE_OTHER): Payer: BC Managed Care – PPO | Admitting: Internal Medicine

## 2013-02-17 ENCOUNTER — Encounter: Payer: Self-pay | Admitting: Internal Medicine

## 2013-02-17 VITALS — BP 130/84 | HR 90 | Temp 98.7°F | Wt 201.0 lb

## 2013-02-17 DIAGNOSIS — M1710 Unilateral primary osteoarthritis, unspecified knee: Secondary | ICD-10-CM | POA: Insufficient documentation

## 2013-02-17 DIAGNOSIS — E039 Hypothyroidism, unspecified: Secondary | ICD-10-CM

## 2013-02-17 DIAGNOSIS — F419 Anxiety disorder, unspecified: Secondary | ICD-10-CM | POA: Insufficient documentation

## 2013-02-17 DIAGNOSIS — F411 Generalized anxiety disorder: Secondary | ICD-10-CM

## 2013-02-17 DIAGNOSIS — E785 Hyperlipidemia, unspecified: Secondary | ICD-10-CM

## 2013-02-17 LAB — COMPREHENSIVE METABOLIC PANEL
ALT: 24 U/L (ref 0–35)
Albumin: 4.2 g/dL (ref 3.5–5.2)
Alkaline Phosphatase: 64 U/L (ref 39–117)
CO2: 24 mEq/L (ref 19–32)
GFR: 133.21 mL/min (ref >60.00)
Potassium: 3.7 mEq/L (ref 3.5–5.1)
Sodium: 138 mEq/L (ref 135–145)
Total Bilirubin: 0.4 mg/dL (ref 0.3–1.2)
Total Protein: 7.5 g/dL (ref 6.0–8.3)

## 2013-02-17 LAB — LIPID PANEL
Total CHOL/HDL Ratio: 7
VLDL: 104.4 mg/dL — ABNORMAL HIGH (ref 0.0–40.0)

## 2013-02-17 LAB — HM PAP SMEAR

## 2013-02-17 MED ORDER — LEVOTHYROXINE SODIUM 50 MCG PO TABS: 50.0000 ug | ORAL_TABLET | Freq: Every day | ORAL | Status: DC

## 2013-02-17 MED ORDER — ALPRAZOLAM 0.25 MG PO TABS: 0.2500 mg | ORAL_TABLET | Freq: Three times a day (TID) | ORAL | Status: DC | PRN

## 2013-02-17 NOTE — Progress Notes (Signed)
Pre-visit discussion using our clinic review tool. No additional management support is needed unless otherwise documented below in the visit note.  

## 2013-02-18 NOTE — Progress Notes (Signed)
   Subjective:    Patient ID: Wendy Clarke, female    DOB: 1960-10-18, 52 y.o.   MRN: [id number]  HPI 52YO female with h/o hypothyroidism, hyperlipidemia, anxiety presents for follow up. She is doing well. Anxiety well controlled with prn alprazolam, which she generally takes only at bedtime.  She is compliant with medications. She notes some dietary indiscretion.  Not currently exercising. Her only other concern today is gradually worsening bilateral knee pain. Described as aching and some instability with weight bearing. No falls. Was seen in past by ortho and had steroid injection with minimal improvement. Not currently taking anything for pain.  Outpatient Encounter Prescriptions as of 02/17/2013  Medication Sig  . ALPRAZolam (XANAX) 0.25 MG tablet Take 1 tablet (0.25 mg total) by mouth 3 (three) times daily as needed.  Marland Kitchen levothyroxine (SYNTHROID, LEVOTHROID) 50 MCG tablet Take 1 tablet (50 mcg total) by mouth daily.   BP 130/84  Pulse 90  Temp(Src) 98.7 F (37.1 C) (Oral)  Wt 201 lb (91.173 kg)  SpO2 94%  Review of Systems  Constitutional: Negative for fever, chills, appetite change, fatigue and unexpected weight change.  HENT: Negative for congestion, ear pain, sinus pressure, sore throat, trouble swallowing and voice change.   Eyes: Negative for visual disturbance.  Respiratory: Negative for cough, shortness of breath, wheezing and stridor.   Cardiovascular: Negative for chest pain, palpitations and leg swelling.  Gastrointestinal: Negative for nausea, vomiting, abdominal pain, diarrhea, constipation, blood in stool, abdominal distention and anal bleeding.  Genitourinary: Negative for dysuria and flank pain.  Musculoskeletal: Negative for arthralgias, gait problem, myalgias and neck pain.  Skin: Negative for color change and rash.  Neurological: Negative for dizziness and headaches.  Hematological: Negative for adenopathy. Does not bruise/bleed easily.  Psychiatric/Behavioral:  Negative for suicidal ideas, sleep disturbance and dysphoric mood. The patient is not nervous/anxious.        Objective:   Physical Exam  Constitutional: She is oriented to person, place, and time. She appears well-developed and well-nourished. No distress.  HENT:  Head: Normocephalic and atraumatic.  Right Ear: External ear normal.  Left Ear: External ear normal.  Nose: Nose normal.  Mouth/Throat: Oropharynx is clear and moist. No oropharyngeal exudate.  Eyes: Conjunctivae are normal. Pupils are equal, round, and reactive to light. Right eye exhibits no discharge. Left eye exhibits no discharge. No scleral icterus.  Neck: Normal range of motion. Neck supple. No tracheal deviation present. No thyromegaly present.  Cardiovascular: Normal rate, regular rhythm, normal heart sounds and intact distal pulses.  Exam reveals no gallop and no friction rub.   No murmur heard. Pulmonary/Chest: Effort normal and breath sounds normal. No accessory muscle usage. Not tachypneic. No respiratory distress. She has no decreased breath sounds. She has no wheezes. She has no rhonchi. She has no rales. She exhibits no tenderness.  Musculoskeletal: Normal range of motion. She exhibits no edema and no tenderness.  Crepitus bilateral knees   Lymphadenopathy:    She has no cervical adenopathy.  Neurological: She is alert and oriented to person, place, and time. No cranial nerve deficit. She exhibits normal muscle tone. Coordination normal.  Skin: Skin is warm and dry. No rash noted. She is not diaphoretic. No erythema. No pallor.  Psychiatric: She has a normal mood and affect. Her behavior is normal. Judgment and thought content normal.          Assessment & Plan:

## 2013-02-18 NOTE — Assessment & Plan Note (Signed)
Lipids noted to be elevated on labs today. Sent pt message encouraging use of statin medication to lower risk of CAD. Pt has declined use of statin medication in the past. Encouraged healthy diet and exercise with goal of weight loss.

## 2013-02-18 NOTE — Assessment & Plan Note (Signed)
Symptomatically doing well. Recent thyroid function normal. Continue Levothyroxine.

## 2013-02-18 NOTE — Assessment & Plan Note (Signed)
Symptoms of anxiety well controlled with alprazolam. Will continue.

## 2013-02-18 NOTE — Assessment & Plan Note (Signed)
Encouraged use of NSAIDS, follow up with ortho. Question if synvisc injection might be helpful. Encouraged strengthening exercises as tolerated.

## 2013-02-19 ENCOUNTER — Encounter: Payer: Self-pay | Admitting: Internal Medicine

## 2013-02-20 MED ORDER — ATORVASTATIN CALCIUM 20 MG PO TABS: 20.0000 mg | ORAL_TABLET | Freq: Every day | ORAL | Status: DC

## 2013-03-13 ENCOUNTER — Encounter: Payer: BC Managed Care – PPO | Admitting: Internal Medicine

## 2013-03-21 ENCOUNTER — Encounter: Payer: BC Managed Care – PPO | Admitting: Internal Medicine

## 2013-03-30 ENCOUNTER — Telehealth: Payer: Self-pay | Admitting: *Deleted

## 2013-03-30 ENCOUNTER — Other Ambulatory Visit: Payer: Self-pay | Admitting: *Deleted

## 2013-03-30 DIAGNOSIS — E785 Hyperlipidemia, unspecified: Secondary | ICD-10-CM

## 2013-03-30 NOTE — Telephone Encounter (Signed)
Pt is coming in for labs tomorrow 0106.2015 what labs and dx? 

## 2013-03-30 NOTE — Telephone Encounter (Signed)
CMP, lipids 272.4

## 2013-03-31 ENCOUNTER — Other Ambulatory Visit (INDEPENDENT_AMBULATORY_CARE_PROVIDER_SITE_OTHER): Payer: BC Managed Care – PPO

## 2013-03-31 DIAGNOSIS — E785 Hyperlipidemia, unspecified: Secondary | ICD-10-CM

## 2013-03-31 LAB — COMPREHENSIVE METABOLIC PANEL
ALBUMIN: 4.1 g/dL (ref 3.5–5.2)
ALT: 28 U/L (ref 0–35)
AST: 24 U/L (ref 0–37)
Alkaline Phosphatase: 64 U/L (ref 39–117)
BUN: 10 mg/dL (ref 6–23)
CHLORIDE: 105 meq/L (ref 96–112)
CO2: 25 meq/L (ref 19–32)
Calcium: 8.9 mg/dL (ref 8.4–10.5)
Creatinine, Ser: 0.6 mg/dL (ref 0.4–1.2)
GFR: 106.29 mL/min (ref >60.00)
GLUCOSE: 125 mg/dL — AB (ref 70–99)
POTASSIUM: 4 meq/L (ref 3.5–5.1)
SODIUM: 138 meq/L (ref 135–145)
TOTAL PROTEIN: 7.2 g/dL (ref 6.0–8.3)
Total Bilirubin: 0.7 mg/dL (ref 0.3–1.2)

## 2013-03-31 LAB — LIPID PANEL
CHOLESTEROL: 153 mg/dL (ref 0–200)
HDL: 40.2 mg/dL (ref >39.00)
LDL Cholesterol: 77 mg/dL (ref 0–99)
Total CHOL/HDL Ratio: 4
Triglycerides: 178 mg/dL — ABNORMAL HIGH (ref 0.0–149.0)
VLDL: 35.6 mg/dL (ref 0.0–40.0)

## 2013-04-20 ENCOUNTER — Encounter: Payer: Self-pay | Admitting: Internal Medicine

## 2013-04-20 MED ORDER — FLUCONAZOLE 150 MG PO TABS: 150.0000 mg | ORAL_TABLET | Freq: Once | ORAL | Status: DC

## 2013-06-20 ENCOUNTER — Other Ambulatory Visit: Payer: Self-pay | Admitting: Internal Medicine

## 2013-06-20 ENCOUNTER — Encounter: Payer: Self-pay | Admitting: Internal Medicine

## 2013-06-23 ENCOUNTER — Encounter: Payer: BC Managed Care – PPO | Admitting: Internal Medicine

## 2013-06-28 ENCOUNTER — Encounter: Payer: BC Managed Care – PPO | Admitting: Internal Medicine

## 2013-08-29 ENCOUNTER — Encounter: Payer: BC Managed Care – PPO | Admitting: Internal Medicine

## 2013-09-01 ENCOUNTER — Encounter: Payer: Self-pay | Admitting: Internal Medicine

## 2013-09-01 ENCOUNTER — Ambulatory Visit (INDEPENDENT_AMBULATORY_CARE_PROVIDER_SITE_OTHER): Payer: BC Managed Care – PPO | Admitting: Internal Medicine

## 2013-09-01 VITALS — BP 124/80 | HR 88 | Temp 98.2°F | Ht 63.5 in | Wt 203.0 lb

## 2013-09-01 DIAGNOSIS — M170 Bilateral primary osteoarthritis of knee: Secondary | ICD-10-CM

## 2013-09-01 DIAGNOSIS — E785 Hyperlipidemia, unspecified: Secondary | ICD-10-CM

## 2013-09-01 DIAGNOSIS — E669 Obesity, unspecified: Secondary | ICD-10-CM

## 2013-09-01 DIAGNOSIS — Z0000 Encounter for general adult medical examination without abnormal findings: Secondary | ICD-10-CM

## 2013-09-01 DIAGNOSIS — M1710 Unilateral primary osteoarthritis, unspecified knee: Secondary | ICD-10-CM

## 2013-09-01 DIAGNOSIS — E039 Hypothyroidism, unspecified: Secondary | ICD-10-CM

## 2013-09-01 DIAGNOSIS — F411 Generalized anxiety disorder: Secondary | ICD-10-CM

## 2013-09-01 DIAGNOSIS — G43009 Migraine without aura, not intractable, without status migrainosus: Secondary | ICD-10-CM

## 2013-09-01 LAB — CBC WITH DIFFERENTIAL/PLATELET
BASOS ABS: 0 10*3/uL (ref 0.0–0.1)
BASOS PCT: 0.5 % (ref 0.0–3.0)
Eosinophils Absolute: 0.3 10*3/uL (ref 0.0–0.7)
Eosinophils Relative: 4 % (ref 0.0–5.0)
HCT: 40.5 % (ref 36.0–46.0)
Hemoglobin: 13.8 g/dL (ref 12.0–15.0)
LYMPHS PCT: 38.8 % (ref 12.0–46.0)
Lymphs Abs: 3 10*3/uL (ref 0.7–4.0)
MCHC: 34.2 g/dL (ref 30.0–36.0)
MCV: 86.6 fl (ref 78.0–100.0)
MONOS PCT: 3.6 % (ref 3.0–12.0)
Monocytes Absolute: 0.3 10*3/uL (ref 0.1–1.0)
NEUTROS ABS: 4.1 10*3/uL (ref 1.4–7.7)
NEUTROS PCT: 53.1 % (ref 43.0–77.0)
Platelets: 193 10*3/uL (ref 150.0–400.0)
RBC: 4.67 Mil/uL (ref 3.87–5.11)
RDW: 14.1 % (ref 11.5–15.5)
WBC: 7.6 10*3/uL (ref 4.0–10.5)

## 2013-09-01 LAB — LIPID PANEL
Cholesterol: 150 mg/dL (ref 0–200)
HDL: 42.7 mg/dL (ref >39.00)
LDL Cholesterol: 75 mg/dL (ref 0–99)
NONHDL: 107.3
Total CHOL/HDL Ratio: 4
Triglycerides: 162 mg/dL — ABNORMAL HIGH (ref 0.0–149.0)
VLDL: 32.4 mg/dL (ref 0.0–40.0)

## 2013-09-01 LAB — COMPREHENSIVE METABOLIC PANEL
ALT: 28 U/L (ref 0–35)
AST: 22 U/L (ref 0–37)
Albumin: 4.4 g/dL (ref 3.5–5.2)
Alkaline Phosphatase: 72 U/L (ref 39–117)
BUN: 13 mg/dL (ref 6–23)
CALCIUM: 9.2 mg/dL (ref 8.4–10.5)
CHLORIDE: 108 meq/L (ref 96–112)
CO2: 27 meq/L (ref 19–32)
CREATININE: 0.6 mg/dL (ref 0.4–1.2)
GFR: 112.37 mL/min (ref >60.00)
Glucose, Bld: 127 mg/dL — ABNORMAL HIGH (ref 70–99)
POTASSIUM: 3.6 meq/L (ref 3.5–5.1)
Sodium: 141 mEq/L (ref 135–145)
Total Bilirubin: 0.7 mg/dL (ref 0.2–1.2)
Total Protein: 7.3 g/dL (ref 6.0–8.3)

## 2013-09-01 LAB — VITAMIN B12: Vitamin B-12: 376 pg/mL (ref 211–911)

## 2013-09-01 LAB — VITAMIN D 25 HYDROXY (VIT D DEFICIENCY, FRACTURES): VITD: 20.31 ng/mL

## 2013-09-01 LAB — TSH: TSH: 2.92 u[IU]/mL (ref 0.35–4.50)

## 2013-09-01 LAB — HEMOGLOBIN A1C: HEMOGLOBIN A1C: 6.1 % (ref 4.6–6.5)

## 2013-09-01 MED ORDER — ALPRAZOLAM 0.25 MG PO TABS: 0.2500 mg | ORAL_TABLET | Freq: Three times a day (TID) | ORAL | Status: DC | PRN

## 2013-09-01 MED ORDER — PHENTERMINE HCL 37.5 MG PO CAPS: 37.5000 mg | ORAL_CAPSULE | ORAL | Status: DC

## 2013-09-01 MED ORDER — ATORVASTATIN CALCIUM 20 MG PO TABS: 20.0000 mg | ORAL_TABLET | Freq: Every day | ORAL | Status: DC

## 2013-09-01 MED ORDER — LEVOTHYROXINE SODIUM 50 MCG PO TABS: 50.0000 ug | ORAL_TABLET | Freq: Every day | ORAL | Status: DC

## 2013-09-01 MED ORDER — SUMATRIPTAN SUCCINATE 50 MG PO TABS: 50.0000 mg | ORAL_TABLET | ORAL | Status: DC | PRN

## 2013-09-01 NOTE — Assessment & Plan Note (Signed)
Will check TSH with labs. Continue Levothyroxine. 

## 2013-09-01 NOTE — Progress Notes (Signed)
Subjective:    Patient ID: Wendy Clarke, female    DOB: 1960-12-01, 53 y.o.   MRN: [id number]  HPI 53YO female presents for annual exam.  Seen by Dr. Katrinka BlazingSmith for knee pain. Pain described as aching, worsened by increased activity. Started on Meloxicam. Minimal improvement with this. Plans to follow up with him and discuss possible steroid injection.   Concerned about weight. Would like to restart phentermine. Looking into diet options. Has been fairly sedentary because of knee pain.   Headaches - Occur monthly. Pos photo and phonophobia. Pos nausea. Improved only with rest, ice packs. [facility]. No improvement ibuprofen or tylenol.  Review of Systems  Constitutional: Negative for fever, chills, appetite change, fatigue and unexpected weight change.  HENT: Negative for congestion and hearing loss.   Eyes: Positive for photophobia. Negative for visual disturbance.  Respiratory: Negative for shortness of breath.   Cardiovascular: Negative for chest pain and leg swelling.  Gastrointestinal: Positive for nausea (with headache). Negative for abdominal pain, constipation, blood in stool and abdominal distention.  Musculoskeletal: Positive for arthralgias and myalgias. Negative for joint swelling.  Skin: Negative for color change and rash.  Neurological: Positive for headaches. Negative for tremors, seizures, weakness, light-headedness and numbness.  Hematological: Negative for adenopathy. Does not bruise/bleed easily.  Psychiatric/Behavioral: Negative for dysphoric mood. The patient is not nervous/anxious.        Objective:    BP 124/80  Pulse 88  Temp(Src) 98.2 F (36.8 C) (Oral)  Ht 5' 3.5" (1.613 m)  Wt 203 lb (92.08 kg)  BMI 35.39 kg/m2  SpO2 96% Physical Exam  Constitutional: She is oriented to person, place, and time. She appears well-developed and well-nourished. No distress.  HENT:  Head: Normocephalic and atraumatic.  Right Ear: External ear normal.    Left Ear: External ear normal.  Nose: Nose normal.  Mouth/Throat: Oropharynx is clear and moist. No oropharyngeal exudate.  Eyes: Conjunctivae are normal. Pupils are equal, round, and reactive to light. Right eye exhibits no discharge. Left eye exhibits no discharge. No scleral icterus.  Neck: Normal range of motion. Neck supple. No tracheal deviation present. No thyromegaly present.  Cardiovascular: Normal rate, regular rhythm, normal heart sounds and intact distal pulses.  Exam reveals no gallop and no friction rub.   No murmur heard. Pulmonary/Chest: Effort normal and breath sounds normal. No accessory muscle usage. Not tachypneic. No respiratory distress. She has no decreased breath sounds. She has no wheezes. She has no rales. She exhibits no tenderness. Right breast exhibits no inverted nipple, no mass, no nipple discharge, no skin change and no tenderness. Left breast exhibits no inverted nipple, no mass, no nipple discharge, no skin change and no tenderness. Breasts are symmetrical.  Abdominal: Soft. Bowel sounds are normal. She exhibits no distension and no mass. There is no tenderness. There is no rebound and no guarding.  Musculoskeletal: Normal range of motion. She exhibits no edema and no tenderness.  Lymphadenopathy:    She has no cervical adenopathy.  Neurological: She is alert and oriented to person, place, and time. No cranial nerve deficit. She exhibits normal muscle tone. Coordination normal.  Skin: Skin is warm and dry. No rash noted. She is not diaphoretic. No erythema. No pallor.  Psychiatric: She has a normal mood and affect. Her behavior is normal. Judgment and thought content normal.          Assessment & Plan:   Problem List Items Addressed This Visit  Unprioritized   Anxiety state, unspecified     Doing well. Continue prn alprazolam.    Relevant Medications      ALPRAZolam  (XANAX) tablet   Hyperlipidemia     Will check lipids and LFTs with labs.  Continue Atorvastatin.    Relevant Medications      atorvastatin (LIPITOR) tablet   Hypothyroidism     Will check TSH with labs. Continue Levothyroxine.    Relevant Medications      levothyroxine (SYNTHROID, LEVOTHROID) tablet   Obesity (BMI 30-39.9)      Wt Readings from Last 3 Encounters:  09/01/13 203 lb (92.08 kg)  02/17/13 201 lb (91.173 kg)  07/13/12 199 lb (90.266 kg)   Body mass index is 35.39 kg/(m^2). Encouraged healthy, Mediterranean style diet and regular exercise with goal of 40min 3x per week. Will restart phentermine. Discussed referral to bariatric clinic, but will hold off for now.    Relevant Medications      phentermine capsule   Other Relevant Orders      HgB A1c   Osteoarthritis, knee     Persistent bilateral knee pain from OA. Encouraged activity such as water activity. Encouraged follow up with ortho for possible steroid injection. Continue Meloxicam.    Relevant Medications      meloxicam (MOBIC) 15 MG tablet   Routine general medical examination at a health care facility - Primary     General medical exam normal today except as noted. Encouraged Mediterranean style diet and exercise. Will check labs today including CBC, CMP, lipids, TSH, A1c. Immunizations UTD. Colonoscopy and Mammogram UTD. PAP deferred as s/p hysterectomy.    Relevant Orders      TSH      CBC with Differential      Comprehensive metabolic panel      Lipid panel      Vit D  25 hydroxy (rtn osteoporosis monitoring)      B12    Other Visit Diagnoses   Nonintractable migraine, unspecified migraine type        Relevant Medications       meloxicam (MOBIC) 15 MG tablet       atorvastatin (LIPITOR) tablet       SUMAtriptan (IMITREX) tablet        Return in about 4 weeks (around 09/29/2013) for Recheck Weight.

## 2013-09-01 NOTE — Assessment & Plan Note (Addendum)
General medical exam normal today except as noted. Encouraged Mediterranean style diet and exercise. Will check labs today including CBC, CMP, lipids, TSH, A1c. Immunizations UTD. Colonoscopy and Mammogram UTD. PAP deferred as s/p hysterectomy.

## 2013-09-01 NOTE — Assessment & Plan Note (Signed)
Will check lipids and LFTs with labs. Continue Atorvastatin. 

## 2013-09-01 NOTE — Patient Instructions (Signed)
Start Phentermine 37.5mg  daily.  Follow up in 4 weeks to recheck BP and weight.

## 2013-09-01 NOTE — Assessment & Plan Note (Signed)
Persistent bilateral knee pain from OA. Encouraged activity such as water activity. Encouraged follow up with ortho for possible steroid injection. Continue Meloxicam.

## 2013-09-01 NOTE — Assessment & Plan Note (Signed)
Doing well. Continue prn alprazolam.

## 2013-09-01 NOTE — Progress Notes (Signed)
Pre visit review using our clinic review tool, if applicable. No additional management support is needed unless otherwise documented below in the visit note. 

## 2013-09-01 NOTE — Assessment & Plan Note (Signed)
Wt Readings from Last 3 Encounters:  09/01/13 203 lb (92.08 kg)  02/17/13 201 lb (91.173 kg)  07/13/12 199 lb (90.266 kg)   Body mass index is 35.39 kg/(m^2). Encouraged healthy, Mediterranean style diet and regular exercise with goal of 40min 3x per week. Will restart phentermine. Discussed referral to bariatric clinic, but will hold off for now.

## 2013-09-04 ENCOUNTER — Telehealth: Payer: Self-pay | Admitting: *Deleted

## 2013-09-04 NOTE — Telephone Encounter (Signed)
Received fax from [facility], needing PA for Phentermine. Started PA online.

## 2013-09-04 NOTE — Telephone Encounter (Signed)
Phentermine approved. Pharmacy notified.

## 2013-10-10 ENCOUNTER — Encounter: Payer: Self-pay | Admitting: Internal Medicine

## 2013-10-17 ENCOUNTER — Ambulatory Visit: Payer: BC Managed Care – PPO | Admitting: Internal Medicine

## 2014-02-02 ENCOUNTER — Ambulatory Visit: Payer: Self-pay | Admitting: Internal Medicine

## 2014-02-05 ENCOUNTER — Telehealth: Payer: Self-pay | Admitting: Internal Medicine

## 2014-02-05 NOTE — Telephone Encounter (Signed)
Recent mammogram from 11/20 showed possible mass in the left breast. They have requested compression views and possible ultrasound for further evaluation. Has this been scheduled?

## 2014-02-05 NOTE — Telephone Encounter (Signed)
Yes, scheduled for 02/12/14

## 2014-02-12 ENCOUNTER — Ambulatory Visit: Payer: Self-pay | Admitting: Internal Medicine

## 2014-02-12 ENCOUNTER — Telehealth: Payer: Self-pay | Admitting: Internal Medicine

## 2014-02-12 NOTE — Telephone Encounter (Signed)
Received report on mammogram additional views which showed circumscribed area left breast, most likely complicated cyst. Per notes, pt opted for follow up mammogram in 6 months. I just want to be sure she does not want to have a surgeon evaluate this now.

## 2014-02-14 NOTE — Telephone Encounter (Signed)
Left vm requesting pt to return my call 

## 2014-02-15 NOTE — Telephone Encounter (Signed)
Pt states that she does not want to have a surgeon evaluate at this time, she will be having a follow up in 6 mths

## 2014-02-22 ENCOUNTER — Encounter: Payer: Self-pay | Admitting: Internal Medicine

## 2014-02-26 LAB — TSH: TSH: 3.63 u[IU]/mL (ref 0.41–5.90)

## 2014-02-26 LAB — LIPID PANEL
Cholesterol: 171 mg/dL (ref 0–200)
HDL: 43 mg/dL (ref 35–70)
Triglycerides: 364 mg/dL — AB (ref 40–160)

## 2014-02-26 LAB — HEMOGLOBIN A1C: HEMOGLOBIN A1C: 6 % (ref 4.0–6.0)

## 2014-02-28 ENCOUNTER — Encounter: Payer: Self-pay | Admitting: Internal Medicine

## 2014-03-01 ENCOUNTER — Encounter: Payer: Self-pay | Admitting: Internal Medicine

## 2014-03-12 ENCOUNTER — Other Ambulatory Visit: Payer: Self-pay | Admitting: Internal Medicine

## 2014-03-12 NOTE — Telephone Encounter (Signed)
Last visit 09/01/13, appt sch 04/06/14

## 2014-03-20 ENCOUNTER — Encounter: Payer: Self-pay | Admitting: Internal Medicine

## 2014-03-21 ENCOUNTER — Encounter: Payer: Self-pay | Admitting: *Deleted

## 2014-03-23 ENCOUNTER — Ambulatory Visit (INDEPENDENT_AMBULATORY_CARE_PROVIDER_SITE_OTHER): Payer: BC Managed Care – PPO | Admitting: Internal Medicine

## 2014-03-23 ENCOUNTER — Encounter: Payer: Self-pay | Admitting: Internal Medicine

## 2014-03-23 VITALS — BP 120/74 | HR 86 | Temp 98.2°F | Ht 63.5 in | Wt 199.8 lb

## 2014-03-23 DIAGNOSIS — R5383 Other fatigue: Secondary | ICD-10-CM | POA: Insufficient documentation

## 2014-03-23 DIAGNOSIS — R5382 Chronic fatigue, unspecified: Secondary | ICD-10-CM

## 2014-03-23 DIAGNOSIS — M2550 Pain in unspecified joint: Secondary | ICD-10-CM | POA: Insufficient documentation

## 2014-03-23 LAB — COMPREHENSIVE METABOLIC PANEL
ALT: 32 U/L (ref 0–35)
AST: 25 U/L (ref 0–37)
Albumin: 4.1 g/dL (ref 3.5–5.2)
Alkaline Phosphatase: 74 U/L (ref 39–117)
BUN: 10 mg/dL (ref 6–23)
CHLORIDE: 109 meq/L (ref 96–112)
CO2: 24 meq/L (ref 19–32)
CREATININE: 0.5 mg/dL (ref 0.4–1.2)
Calcium: 9 mg/dL (ref 8.4–10.5)
GFR: 142.3 mL/min (ref >60.00)
Glucose, Bld: 116 mg/dL — ABNORMAL HIGH (ref 70–99)
Potassium: 4 mEq/L (ref 3.5–5.1)
SODIUM: 141 meq/L (ref 135–145)
Total Bilirubin: 0.5 mg/dL (ref 0.2–1.2)
Total Protein: 6.8 g/dL (ref 6.0–8.3)

## 2014-03-23 LAB — CBC WITH DIFFERENTIAL/PLATELET
BASOS PCT: 0.5 % (ref 0.0–3.0)
Basophils Absolute: 0 10*3/uL (ref 0.0–0.1)
Eosinophils Absolute: 0.4 10*3/uL (ref 0.0–0.7)
Eosinophils Relative: 5.4 % — ABNORMAL HIGH (ref 0.0–5.0)
HEMATOCRIT: 40.8 % (ref 36.0–46.0)
Hemoglobin: 13.7 g/dL (ref 12.0–15.0)
Lymphocytes Relative: 35.3 % (ref 12.0–46.0)
Lymphs Abs: 2.8 10*3/uL (ref 0.7–4.0)
MCHC: 33.5 g/dL (ref 30.0–36.0)
MCV: 86.8 fl (ref 78.0–100.0)
Monocytes Absolute: 0.2 10*3/uL (ref 0.1–1.0)
Monocytes Relative: 3.1 % (ref 3.0–12.0)
NEUTROS PCT: 55.7 % (ref 43.0–77.0)
Neutro Abs: 4.4 10*3/uL (ref 1.4–7.7)
PLATELETS: 204 10*3/uL (ref 150.0–400.0)
RBC: 4.7 Mil/uL (ref 3.87–5.11)
RDW: 13.7 % (ref 11.5–15.5)
WBC: 8 10*3/uL (ref 4.0–10.5)

## 2014-03-23 LAB — TSH: TSH: 2.66 u[IU]/mL (ref 0.35–4.50)

## 2014-03-23 LAB — T3, FREE: T3 FREE: 3.1 pg/mL (ref 2.3–4.2)

## 2014-03-23 LAB — T4, FREE: FREE T4: 0.78 ng/dL (ref 0.60–1.60)

## 2014-03-23 LAB — C-REACTIVE PROTEIN: CRP: 0.7 mg/dL (ref 0.5–20.0)

## 2014-03-23 LAB — SEDIMENTATION RATE: Sed Rate: 26 mm/hr — ABNORMAL HIGH (ref 0–22)

## 2014-03-23 LAB — VITAMIN B12: Vitamin B-12: 335 pg/mL (ref 211–911)

## 2014-03-23 NOTE — Patient Instructions (Signed)
Labs today.  Follow up in 4 weeks. 

## 2014-03-23 NOTE — Assessment & Plan Note (Signed)
Recent worsening symptoms of fatigue. Suspect interrupted sleep playing a role. Will check CBC, CMP, markers of inflammation, B12, TSH, T4, T3. Follow up in 4 weeks and sooner as needed.

## 2014-03-23 NOTE — Assessment & Plan Note (Signed)
Diffuse arthralgia in knees and ankles. Likely OA, but will check markers of inflammatory arthropathy.

## 2014-03-23 NOTE — Progress Notes (Signed)
Subjective:    Patient ID: Wendy Clarke, female    DOB: 03-29-1960, 54 y.o.   MRN: [id number]  HPI 54YO female presents for acute visit.  Feeling exhausted during the day for several months. Went for eval for gastric bypass. Reported to have low thyroid function.   Having trouble sleeping at night because of stressors at work. Wakes frequently at night. Takes alprazolam at night which helps some with sleep. Unsure how many hours of sleep she gets.  No focal symptoms. No chest pain, dyspnea, abdominal pain, change in bowel habits.   Wt Readings from Last 3 Encounters:  03/23/14 199 lb 12 oz (90.606 kg)  09/01/13 203 lb (92.08 kg)  02/17/13 201 lb (91.173 kg)    Past medical, surgical, family and social history per today's encounter.  Review of Systems  Constitutional: Positive for fatigue. Negative for fever, chills, appetite change and unexpected weight change.  Eyes: Negative for visual disturbance.  Respiratory: Negative for shortness of breath.   Cardiovascular: Negative for chest pain and leg swelling.  Gastrointestinal: Negative for nausea, vomiting, abdominal pain, diarrhea and constipation.  Musculoskeletal: Negative for myalgias and arthralgias.  Skin: Negative for color change and rash.  Hematological: Negative for adenopathy. Does not bruise/bleed easily.  Psychiatric/Behavioral: Positive for sleep disturbance. Negative for dysphoric mood. The patient is nervous/anxious.        Objective:    BP 120/74 mmHg  Pulse 86  Temp(Src) 98.2 F (36.8 C) (Oral)  Ht 5' 3.5" (1.613 m)  Wt 199 lb 12 oz (90.606 kg)  BMI 34.82 kg/m2  SpO2 95% Physical Exam  Constitutional: She is oriented to person, place, and time. She appears well-developed and well-nourished. No distress.  HENT:  Head: Normocephalic and atraumatic.  Right Ear: External ear normal.  Left Ear: External ear normal.  Nose: Nose normal.  Mouth/Throat: Oropharynx is clear and moist. No oropharyngeal  exudate.  Eyes: Conjunctivae are normal. Pupils are equal, round, and reactive to light. Right eye exhibits no discharge. Left eye exhibits no discharge. No scleral icterus.  Neck: Normal range of motion. Neck supple. No tracheal deviation present. No thyromegaly present.  Cardiovascular: Normal rate, regular rhythm, normal heart sounds and intact distal pulses.  Exam reveals no gallop and no friction rub.   No murmur heard. Pulmonary/Chest: Effort normal and breath sounds normal. No accessory muscle usage. No tachypnea. No respiratory distress. She has no decreased breath sounds. She has no wheezes. She has no rhonchi. She has no rales. She exhibits no tenderness.  Musculoskeletal: Normal range of motion. She exhibits no edema or tenderness.  Lymphadenopathy:    She has no cervical adenopathy.  Neurological: She is alert and oriented to person, place, and time. No cranial nerve deficit. She exhibits normal muscle tone. Coordination normal.  Skin: Skin is warm and dry. No rash noted. She is not diaphoretic. No erythema. No pallor.  Psychiatric: She has a normal mood and affect. Her behavior is normal. Judgment and thought content normal.          Assessment & Plan:   Problem List Items Addressed This Visit      Unprioritized   Arthralgia    Diffuse arthralgia in knees and ankles. Likely OA, but will check markers of inflammatory arthropathy.    Relevant Orders      ANA      Rheumatoid Factor      Sed Rate (ESR)      C-reactive protein   Fatigue - Primary  Recent worsening symptoms of fatigue. Suspect interrupted sleep playing a role. Will check CBC, CMP, markers of inflammation, B12, TSH, T4, T3. Follow up in 4 weeks and sooner as needed.    Relevant Orders      T4, free      TSH      T3, free      Comprehensive metabolic panel      P23      CBC w/Diff       Return in about 4 weeks (around 04/20/2014) for Recheck.

## 2014-03-23 NOTE — Progress Notes (Signed)
Pre visit review using our clinic review tool, if applicable. No additional management support is needed unless otherwise documented below in the visit note. 

## 2014-03-24 LAB — RHEUMATOID FACTOR: Rhuematoid fact SerPl-aCnc: <10 IU/mL (ref <=14)

## 2014-03-26 LAB — ANA: Anti Nuclear Antibody(ANA): NEGATIVE

## 2014-03-29 ENCOUNTER — Ambulatory Visit: Payer: BC Managed Care – PPO | Admitting: Internal Medicine

## 2014-04-06 ENCOUNTER — Ambulatory Visit: Payer: BC Managed Care – PPO | Admitting: Internal Medicine

## 2014-04-20 ENCOUNTER — Ambulatory Visit: Payer: BC Managed Care – PPO | Admitting: Internal Medicine

## 2014-05-02 ENCOUNTER — Encounter: Payer: Self-pay | Admitting: Internal Medicine

## 2014-05-04 ENCOUNTER — Encounter: Payer: Self-pay | Admitting: Internal Medicine

## 2014-05-17 ENCOUNTER — Encounter: Payer: Self-pay | Admitting: Internal Medicine

## 2014-05-19 ENCOUNTER — Other Ambulatory Visit: Payer: Self-pay | Admitting: Internal Medicine

## 2014-05-20 NOTE — Telephone Encounter (Signed)
Okay to refill? Last seen on 03/21/14, next appt on 05/24/14.

## 2014-05-21 ENCOUNTER — Encounter: Payer: Self-pay | Admitting: Internal Medicine

## 2014-05-24 ENCOUNTER — Ambulatory Visit (INDEPENDENT_AMBULATORY_CARE_PROVIDER_SITE_OTHER): Payer: BC Managed Care – PPO | Admitting: Internal Medicine

## 2014-05-24 ENCOUNTER — Encounter: Payer: Self-pay | Admitting: Internal Medicine

## 2014-05-24 VITALS — BP 114/71 | HR 87 | Temp 97.9°F | Ht 63.5 in | Wt 200.2 lb

## 2014-05-24 DIAGNOSIS — R197 Diarrhea, unspecified: Secondary | ICD-10-CM | POA: Insufficient documentation

## 2014-05-24 LAB — COMPREHENSIVE METABOLIC PANEL
ALT: 30 U/L (ref 0–35)
AST: 26 U/L (ref 0–37)
Albumin: 4.1 g/dL (ref 3.5–5.2)
Alkaline Phosphatase: 68 U/L (ref 39–117)
BUN: 10 mg/dL (ref 6–23)
CO2: 26 meq/L (ref 19–32)
Calcium: 9 mg/dL (ref 8.4–10.5)
Chloride: 107 mEq/L (ref 96–112)
Creatinine, Ser: 0.57 mg/dL (ref 0.40–1.20)
GFR: 116.63 mL/min (ref >60.00)
Glucose, Bld: 137 mg/dL — ABNORMAL HIGH (ref 70–99)
POTASSIUM: 3.7 meq/L (ref 3.5–5.1)
Sodium: 139 mEq/L (ref 135–145)
Total Bilirubin: 0.6 mg/dL (ref 0.2–1.2)
Total Protein: 6.9 g/dL (ref 6.0–8.3)

## 2014-05-24 LAB — CBC WITH DIFFERENTIAL/PLATELET
Basophils Absolute: 0 10*3/uL (ref 0.0–0.1)
Basophils Relative: 0.5 % (ref 0.0–3.0)
EOS PCT: 4.5 % (ref 0.0–5.0)
Eosinophils Absolute: 0.3 10*3/uL (ref 0.0–0.7)
HCT: 39.2 % (ref 36.0–46.0)
Hemoglobin: 13.6 g/dL (ref 12.0–15.0)
Lymphocytes Relative: 35.9 % (ref 12.0–46.0)
Lymphs Abs: 2.5 10*3/uL (ref 0.7–4.0)
MCHC: 34.6 g/dL (ref 30.0–36.0)
MCV: 83.8 fl (ref 78.0–100.0)
Monocytes Absolute: 0.3 10*3/uL (ref 0.1–1.0)
Monocytes Relative: 3.9 % (ref 3.0–12.0)
Neutro Abs: 3.8 10*3/uL (ref 1.4–7.7)
Neutrophils Relative %: 55.2 % (ref 43.0–77.0)
Platelets: 185 10*3/uL (ref 150.0–400.0)
RBC: 4.67 Mil/uL (ref 3.87–5.11)
RDW: 13.8 % (ref 11.5–15.5)
WBC: 7 10*3/uL (ref 4.0–10.5)

## 2014-05-24 NOTE — Progress Notes (Signed)
Pre visit review using our clinic review tool, if applicable. No additional management support is needed unless otherwise documented below in the visit note. 

## 2014-05-24 NOTE — Progress Notes (Signed)
Subjective:    Patient ID: Wendy Clarke, female    DOB: [birth year], 54 y.o.   MRN: [id number]  HPI 54YO female presents for acute visit.  Diarrhea - Over last 2 months. Has diarrhea with every meal. Several episodes per day. Described as loose stool. Some abdominal cramping prior to BMs, but no persistent pain. No blood. Food appears undigested. Tried Imodium with no improvement. No recent travel. No camping. Last colonoscopy 2012 was normal. No nausea, vomiting. Appetite is poor.   Past medical, surgical, family and social history per today's encounter.  Review of Systems  Constitutional: Positive for appetite change. Negative for fever, chills, fatigue and unexpected weight change.  Eyes: Negative for visual disturbance.  Respiratory: Negative for shortness of breath.   Cardiovascular: Negative for chest pain and leg swelling.  Gastrointestinal: Positive for abdominal pain and diarrhea. Negative for nausea, vomiting, constipation, blood in stool, abdominal distention and anal bleeding.  Skin: Negative for color change and rash.  Hematological: Negative for adenopathy. Does not bruise/bleed easily.  Psychiatric/Behavioral: Negative for dysphoric mood. The patient is not nervous/anxious.        Objective:    BP 114/71 mmHg  Pulse 87  Temp(Src) 97.9 F (36.6 C) (Oral)  Ht 5' 3.5" (1.613 m)  Wt 200 lb 4 oz (90.833 kg)  BMI 34.91 kg/m2  SpO2 93% Physical Exam  Constitutional: She is oriented to person, place, and time. She appears well-developed and well-nourished. No distress.  HENT:  Head: Normocephalic and atraumatic.  Right Ear: External ear normal.  Left Ear: External ear normal.  Nose: Nose normal.  Mouth/Throat: Oropharynx is clear and moist. No oropharyngeal exudate.  Eyes: Conjunctivae are normal. Pupils are equal, round, and reactive to light. Right eye exhibits no discharge. Left eye exhibits no discharge. No scleral icterus.  Neck: Normal range of motion. Neck  supple. No tracheal deviation present. No thyromegaly present.  Cardiovascular: Normal rate, regular rhythm, normal heart sounds and intact distal pulses.  Exam reveals no gallop and no friction rub.   No murmur heard. Pulmonary/Chest: Effort normal and breath sounds normal. No respiratory distress. She has no wheezes. She has no rales. She exhibits no tenderness.  Abdominal: Soft. Bowel sounds are normal. She exhibits no distension and no mass. There is no tenderness. There is no rebound and no guarding.  Musculoskeletal: Normal range of motion. She exhibits no edema or tenderness.  Lymphadenopathy:    She has no cervical adenopathy.  Neurological: She is alert and oriented to person, place, and time. No cranial nerve deficit. She exhibits normal muscle tone. Coordination normal.  Skin: Skin is warm and dry. No rash noted. She is not diaphoretic. No erythema. No pallor.  Psychiatric: She has a normal mood and affect. Her behavior is normal. Judgment and thought content normal.          Assessment & Plan:   Problem List Items Addressed This Visit      Unprioritized   Diarrhea - Primary    Recent watery diarrhea. No recent travel. Suspect that use of artificial sweeteners is contributing. Encouraged her to avoid dairy products and artificial sweeteners.  Gave information on FODMAP diet. Will check stool culture, CDiff, and OP. Will set up GI evaluation for colonoscopy to evaluate for microscopic colitis if symptoms are persistent. Follow up in 2 weeks and prn.      Relevant Orders   Stool Culture   Stool C-Diff Toxin Assay   Ova and parasite examination  Comprehensive metabolic panel   CBC w/Diff   Ambulatory referral to Gastroenterology       Return in about 4 weeks (around 06/21/2014) for Recheck.

## 2014-05-24 NOTE — Patient Instructions (Signed)
We will send stool for culture and perform blood work today.  Please consider starting a FODMAP diet.  We will set up an evaluation with GI for endoscopy.  Follow up in 4 weeks.

## 2014-05-24 NOTE — Assessment & Plan Note (Signed)
Recent watery diarrhea. No recent travel. Suspect that use of artificial sweeteners is contributing. Encouraged her to avoid dairy products and artificial sweeteners.  Gave information on FODMAP diet. Will check stool culture, CDiff, and OP. Will set up GI evaluation for colonoscopy to evaluate for microscopic colitis if symptoms are persistent. Follow up in 2 weeks and prn.

## 2014-05-25 LAB — C. DIFFICILE GDH AND TOXIN A/B
C. difficile GDH: NOT DETECTED
C. difficile Toxin A/B: NOT DETECTED

## 2014-05-25 LAB — OVA AND PARASITE EXAMINATION: OP: NONE SEEN

## 2014-05-28 LAB — STOOL CULTURE

## 2014-06-05 ENCOUNTER — Encounter: Payer: Self-pay | Admitting: Internal Medicine

## 2014-06-18 ENCOUNTER — Encounter: Payer: Self-pay | Admitting: Internal Medicine

## 2014-06-18 DIAGNOSIS — R928 Other abnormal and inconclusive findings on diagnostic imaging of breast: Secondary | ICD-10-CM

## 2014-06-18 NOTE — Telephone Encounter (Signed)
Please order 6 month mammogram that is needed, see mychart message

## 2014-06-18 NOTE — Telephone Encounter (Signed)
Myriam JacobsonHelen - I have placed the order. Can you set this up?

## 2014-06-25 ENCOUNTER — Encounter: Payer: Self-pay | Admitting: Internal Medicine

## 2014-06-27 ENCOUNTER — Ambulatory Visit: Payer: BC Managed Care – PPO | Admitting: Internal Medicine

## 2014-08-09 ENCOUNTER — Other Ambulatory Visit: Payer: Self-pay | Admitting: Internal Medicine

## 2014-08-09 DIAGNOSIS — N63 Unspecified lump in unspecified breast: Secondary | ICD-10-CM

## 2014-08-17 ENCOUNTER — Telehealth: Payer: Self-pay | Admitting: Internal Medicine

## 2014-08-17 ENCOUNTER — Ambulatory Visit
Admission: RE | Admit: 2014-08-17 | Discharge: 2014-08-17 | Disposition: A | Discharge status: Home / Self Care | Payer: BC Managed Care – PPO | Source: Ambulatory Visit | Attending: Internal Medicine | Admitting: Internal Medicine

## 2014-08-17 DIAGNOSIS — N63 Unspecified lump in unspecified breast: Secondary | ICD-10-CM

## 2014-08-20 ENCOUNTER — Encounter: Payer: Self-pay | Admitting: Internal Medicine

## 2014-09-15 ENCOUNTER — Other Ambulatory Visit: Payer: Self-pay | Admitting: Internal Medicine

## 2014-10-22 ENCOUNTER — Other Ambulatory Visit: Payer: Self-pay | Admitting: Internal Medicine

## 2014-10-22 NOTE — Telephone Encounter (Signed)
Alprazolam last refilled 05/20/14 for #90 with 2 refills. Last office visit 05/24/14 for acute. Ok to refill?

## 2014-10-22 NOTE — Telephone Encounter (Signed)
Rx faxed to [facility] pharmacy

## 2014-11-13 ENCOUNTER — Other Ambulatory Visit: Payer: Self-pay | Admitting: Internal Medicine

## 2014-12-17 IMAGING — US US BREAST*L* LIMITED INC AXILLA
1 series · 6 of 6 positions shown · non-contrast
Comparison: 02/02/2014, 11/29/2012, 09/22/2011

CLINICAL DATA: Further evaluation of possible right breast mass

EXAM:
DIGITAL DIAGNOSTIC  RIGHT MAMMOGRAM
ULTRASOUND RIGHT BREAST

[Series 1: us breast*left* limited inc axilla · 0.08mm/px · 6 of 6 slices shown]
[im 1/6]
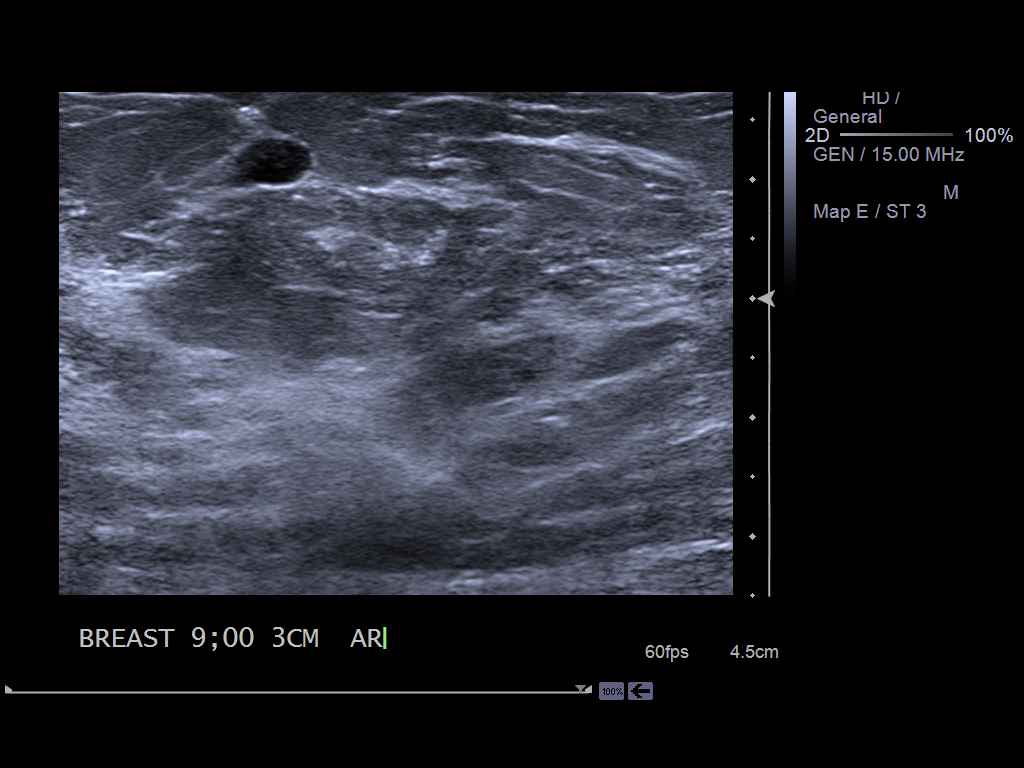
[im 2/6]
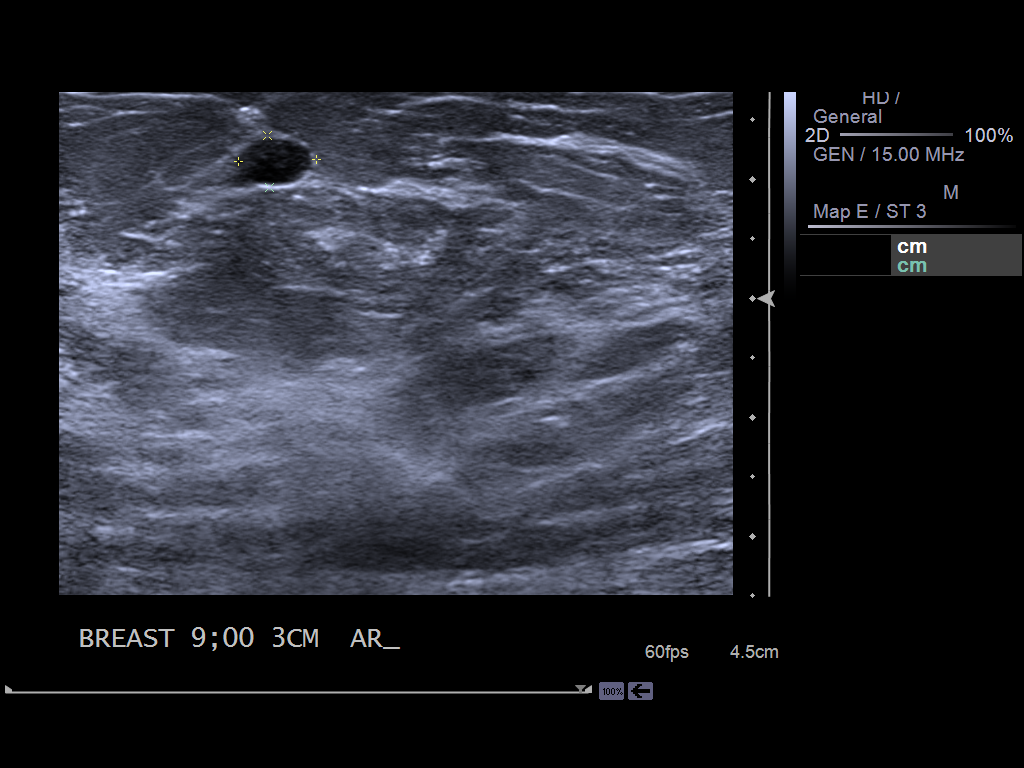
[im 3/6]
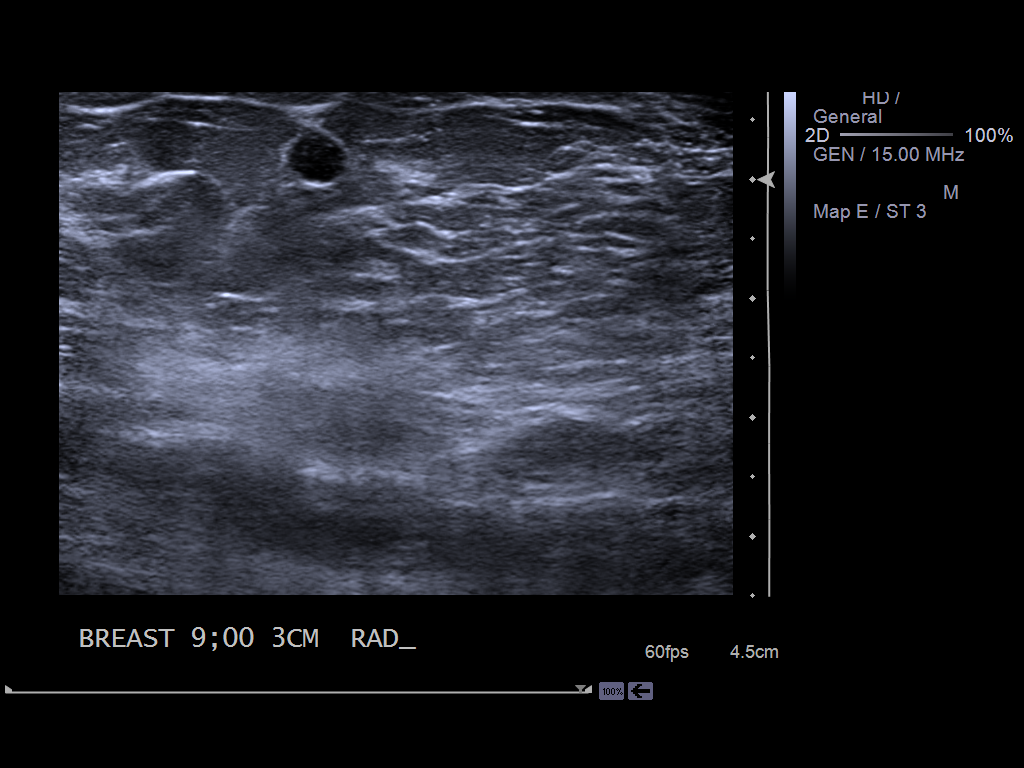
[im 4/6]
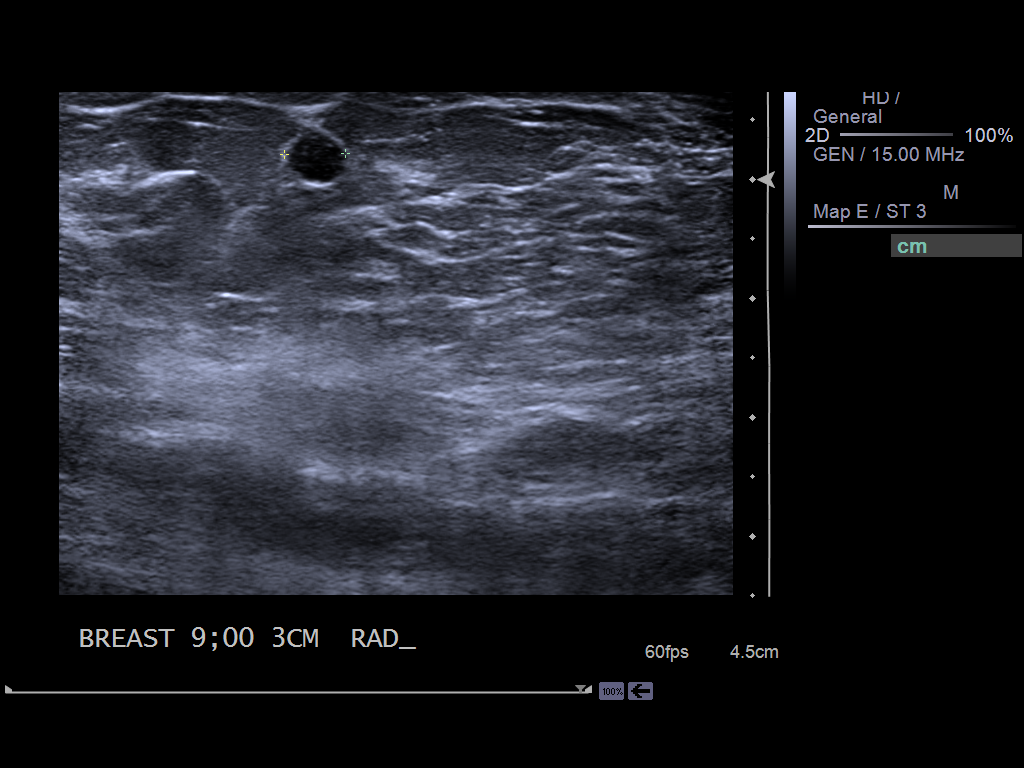
[im 5/6]
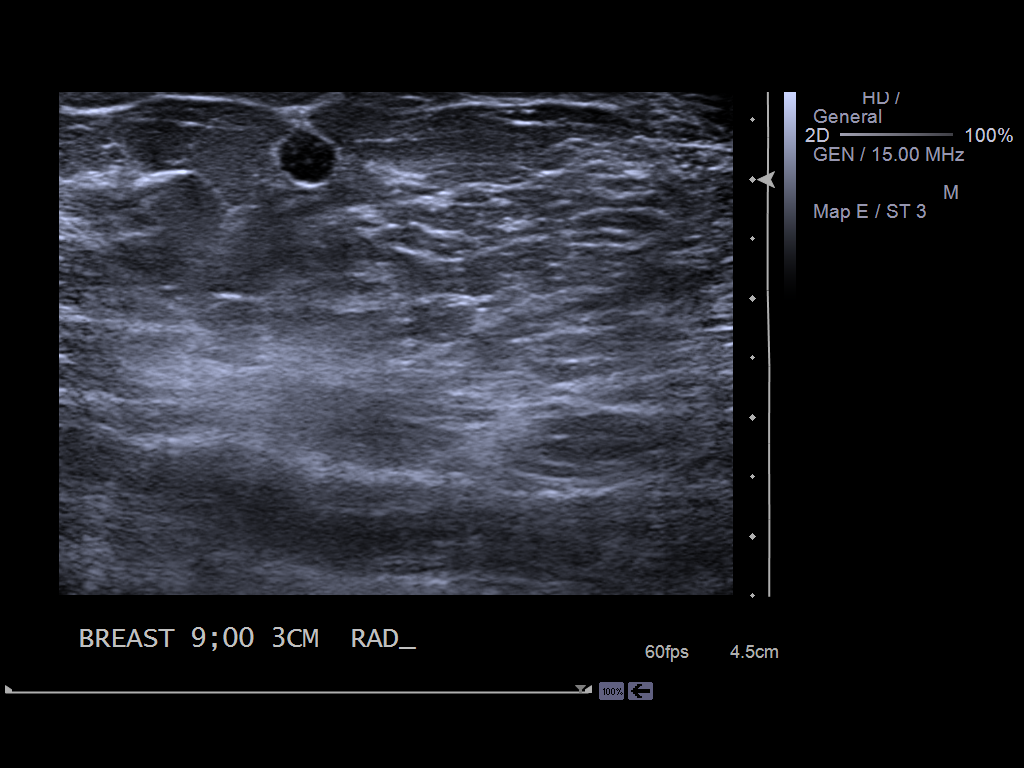
[im 6/6]
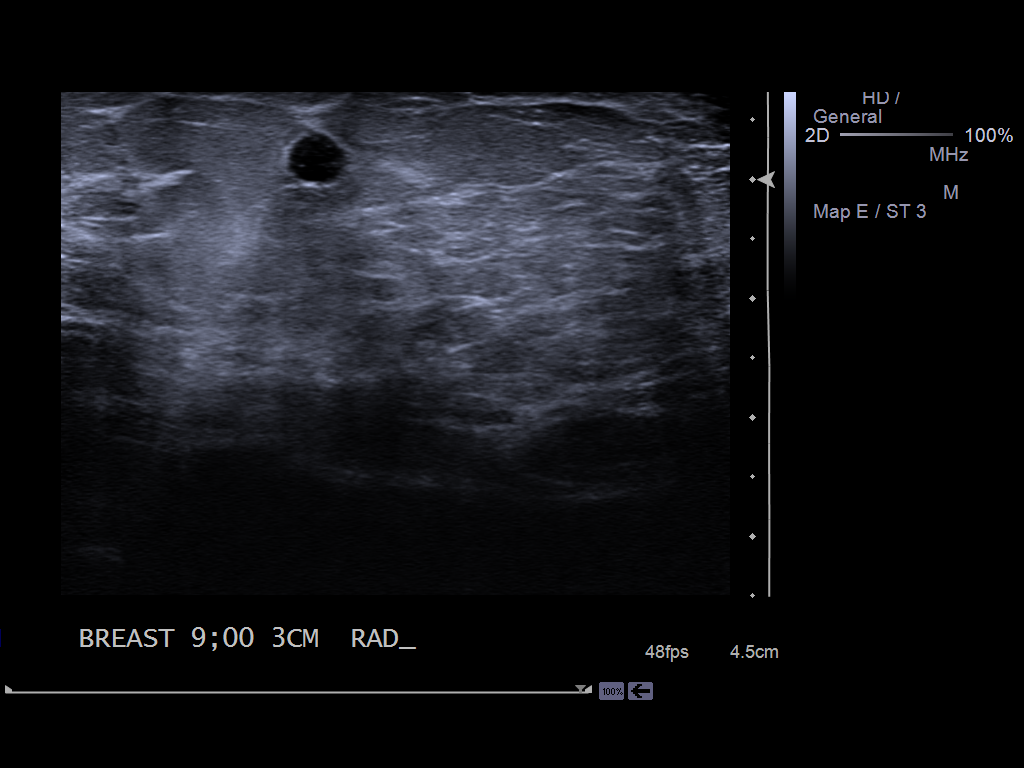

[6 of 6 positions shown; findings below may reference images not displayed]

ACR Breast Density Category b: There are scattered areas of
fibroglandular density.
FINDINGS: There is a persistent oval circumscribed 8 mm mass 9 o'clock
position left breast anteriorly.

On physical exam, there are no palpable abnormalities.

Ultrasound is performed, showing an oval circumscribed nearly
anechoic mass 3 cm from the nipple in the 9 o'clock position of the
left breast. It shows parallel orientation with a few internal
echoes a and minimally increased sound transmission. It measures 7 x
4 x 5 mm.
IMPRESSION: Mildly complicated cyst 9 o'clock position left breast

RECOMMENDATION:
Diagnostic left mammogram and ultrasound in 6 months. The option of
biopsy was discussed.

I have discussed the findings and recommendations with the patient.
Results were also provided in writing at the conclusion of the
visit. If applicable, a reminder letter will be sent to the patient
regarding the next appointment.

BI-RADS CATEGORY  3: Probably benign.

## 2015-03-21 ENCOUNTER — Other Ambulatory Visit: Payer: Self-pay | Admitting: Internal Medicine

## 2015-03-21 ENCOUNTER — Other Ambulatory Visit: Payer: Self-pay

## 2015-03-21 MED ORDER — ATORVASTATIN CALCIUM 20 MG PO TABS: ORAL_TABLET | ORAL | Status: DC

## 2015-04-10 ENCOUNTER — Ambulatory Visit (INDEPENDENT_AMBULATORY_CARE_PROVIDER_SITE_OTHER): Payer: BC Managed Care – PPO | Admitting: Internal Medicine

## 2015-04-10 ENCOUNTER — Encounter: Payer: Self-pay | Admitting: Internal Medicine

## 2015-04-10 VITALS — BP 149/80 | HR 89 | Temp 98.0°F | Ht 64.0 in | Wt 204.2 lb

## 2015-04-10 DIAGNOSIS — E039 Hypothyroidism, unspecified: Secondary | ICD-10-CM | POA: Diagnosis not present

## 2015-04-10 DIAGNOSIS — E785 Hyperlipidemia, unspecified: Secondary | ICD-10-CM

## 2015-04-10 LAB — COMPREHENSIVE METABOLIC PANEL
ALBUMIN: 4.2 g/dL (ref 3.5–5.2)
ALK PHOS: 69 U/L (ref 39–117)
ALT: 25 U/L (ref 0–35)
AST: 22 U/L (ref 0–37)
BILIRUBIN TOTAL: 0.5 mg/dL (ref 0.2–1.2)
BUN: 10 mg/dL (ref 6–23)
CALCIUM: 9.2 mg/dL (ref 8.4–10.5)
CO2: 26 mEq/L (ref 19–32)
Chloride: 107 mEq/L (ref 96–112)
Creatinine, Ser: 0.61 mg/dL (ref 0.40–1.20)
GFR: 107.51 mL/min (ref >60.00)
Glucose, Bld: 132 mg/dL — ABNORMAL HIGH (ref 70–99)
Potassium: 4.2 mEq/L (ref 3.5–5.1)
Sodium: 142 mEq/L (ref 135–145)
Total Protein: 6.9 g/dL (ref 6.0–8.3)

## 2015-04-10 LAB — LIPID PANEL
CHOLESTEROL: 150 mg/dL (ref 0–200)
HDL: 39.9 mg/dL (ref >39.00)
NonHDL: 109.86
Total CHOL/HDL Ratio: 4
Triglycerides: 208 mg/dL — ABNORMAL HIGH (ref 0.0–149.0)
VLDL: 41.6 mg/dL — ABNORMAL HIGH (ref 0.0–40.0)

## 2015-04-10 LAB — LDL CHOLESTEROL, DIRECT: LDL DIRECT: 74 mg/dL

## 2015-04-10 LAB — TSH: TSH: 4.1 u[IU]/mL (ref 0.35–4.50)

## 2015-04-10 MED ORDER — ATORVASTATIN CALCIUM 20 MG PO TABS: ORAL_TABLET | ORAL | Status: DC

## 2015-04-10 MED ORDER — LEVOTHYROXINE SODIUM 50 MCG PO TABS: 50.0000 ug | ORAL_TABLET | Freq: Every day | ORAL | Status: AC

## 2015-04-10 NOTE — Assessment & Plan Note (Signed)
Lipids and LFTs with labs today. Refilled Atorvastatin x 1 month. She will then establish care with new provider.

## 2015-04-10 NOTE — Progress Notes (Signed)
Subjective:    Patient ID: Wendy Clarke, female    DOB: [birth year], 55 y.o.   MRN: [id number]  HPI  55YO female presents for follow up. She is late to her visit. She is screaming at front office staff, upset about copay for insurance.  She is then yelling at me, upset that she was told she was late. States she will be changing practices. Needs refills on medication. No other concerns.   Wt Readings from Last 3 Encounters:  04/10/15 204 lb 4 oz (92.647 kg)  05/24/14 200 lb 4 oz (90.833 kg)  03/23/14 199 lb 12 oz (90.606 kg)   BP Readings from Last 3 Encounters:  04/10/15 149/80  05/24/14 114/71  03/23/14 120/74    Past Medical History  Diagnosis Date  . Hyperlipidemia   . Obesity   . Hypothyroidism    Family History  Problem Relation Age of Onset  . Heart disease Father   . Diabetes Father   . Diabetes Sister    Past Surgical History  Procedure Laterality Date  . Abdominal hysterectomy    . Appendectomy    . Ganglion cyst excision    . Braces     Social History   Social History  . Marital Status: Divorced    Spouse Name: N/A  . Number of Children: N/A  . Years of Education: N/A   Social History Main Topics  . Smoking status: Former Smoker -- 0.25 packs/day    Types: Cigarettes  . Smokeless tobacco: Never Used  . Alcohol Use: No  . Drug Use: No  . Sexual Activity: Not Asked   Other Topics Concern  . None   Social History Narrative   ** Merged History Encounter **        Review of Systems  Constitutional: Negative for fever, chills, appetite change, fatigue and unexpected weight change.  Eyes: Negative for visual disturbance.  Respiratory: Negative for shortness of breath.   Cardiovascular: Negative for chest pain and leg swelling.  Gastrointestinal: Negative for abdominal pain.  Skin: Negative for color change and rash.  Hematological: Negative for adenopathy. Does not bruise/bleed easily.  Psychiatric/Behavioral: Negative for  dysphoric mood. The patient is not nervous/anxious.        Objective:    BP 149/80 mmHg  Pulse 89  Temp(Src) 98 F (36.7 C) (Oral)  Ht  (1.626 m)  Wt 204 lb 4 oz (92.647 kg)  BMI 35.04 kg/m2  SpO2 99% Physical Exam  Constitutional: She is oriented to person, place, and time. She appears well-developed and well-nourished. No distress.  HENT:  Head: Normocephalic and atraumatic.  Right Ear: External ear normal.  Left Ear: External ear normal.  Nose: Nose normal.  Mouth/Throat: Oropharynx is clear and moist.  Eyes: Conjunctivae are normal. Pupils are equal, round, and reactive to light. Right eye exhibits no discharge. Left eye exhibits no discharge. No scleral icterus.  Neck: Normal range of motion. Neck supple. No tracheal deviation present. No thyromegaly present.  Cardiovascular: Normal rate, regular rhythm, normal heart sounds and intact distal pulses.  Exam reveals no gallop and no friction rub.   No murmur heard. Pulmonary/Chest: Effort normal and breath sounds normal. No respiratory distress. She has no wheezes. She has no rales. She exhibits no tenderness.  Musculoskeletal: Normal range of motion. She exhibits no edema or tenderness.  Lymphadenopathy:    She has no cervical adenopathy.  Neurological: She is alert and oriented to person, place, and time. No cranial  nerve deficit. She exhibits normal muscle tone. Coordination normal.  Skin: Skin is warm and dry. No rash noted. She is not diaphoretic. No erythema. No pallor.  Psychiatric: Judgment and thought content normal. Her affect is angry. She is agitated.          Assessment & Plan:   Problem List Items Addressed This Visit      Unprioritized   Hyperlipidemia   Relevant Medications   atorvastatin (LIPITOR) 20 MG tablet   Other Relevant Orders   Comprehensive metabolic panel   Lipid Profile   Hypothyroidism - Primary    TSH with labs today. Refilled Levothyroxine x1 month, until she establishes care at  another office.      Relevant Medications   levothyroxine (SYNTHROID, LEVOTHROID) 50 MCG tablet   Other Relevant Orders   TSH       No Follow-up on file.

## 2015-04-10 NOTE — Patient Instructions (Signed)
Labs today

## 2015-04-10 NOTE — Assessment & Plan Note (Signed)
TSH with labs today. Refilled Levothyroxine x1 month, until she establishes care at another office.

## 2015-04-10 NOTE — Progress Notes (Signed)
Pre visit review using our clinic review tool, if applicable. No additional management support is needed unless otherwise documented below in the visit note. 

## 2015-04-15 ENCOUNTER — Encounter: Payer: Self-pay | Admitting: *Deleted

## 2015-04-16 ENCOUNTER — Other Ambulatory Visit: Payer: Self-pay | Admitting: Internal Medicine

## 2015-04-16 ENCOUNTER — Telehealth: Payer: Self-pay | Admitting: Internal Medicine

## 2015-04-16 ENCOUNTER — Telehealth: Payer: Self-pay

## 2015-04-16 ENCOUNTER — Encounter: Payer: Self-pay | Admitting: Internal Medicine

## 2015-04-16 NOTE — Telephone Encounter (Signed)
Patient dismissed from [facility] by Ronna Polio MD , effective April 15, 2015. Dismissal letter sent out by certified / registered mail.  DAJ  Received signed domestic return receipt verifying delivery of certified letter on April 19, 2015. Article number [id number]

## 2015-04-16 NOTE — Telephone Encounter (Signed)
Patient dismissal Process started in HIM.  

## 2015-07-20 ENCOUNTER — Observation Stay
Admission: EM | Admit: 2015-07-20 | Discharge: 2015-07-21 | Disposition: A | Discharge status: Home / Self Care | Payer: BC Managed Care – PPO | Attending: Internal Medicine | Admitting: Internal Medicine

## 2015-07-20 ENCOUNTER — Encounter: Payer: Self-pay | Admitting: Emergency Medicine

## 2015-07-20 ENCOUNTER — Emergency Department: Payer: BC Managed Care – PPO

## 2015-07-20 DIAGNOSIS — Z87891 Personal history of nicotine dependence: Secondary | ICD-10-CM | POA: Diagnosis not present

## 2015-07-20 DIAGNOSIS — R05 Cough: Secondary | ICD-10-CM | POA: Insufficient documentation

## 2015-07-20 DIAGNOSIS — E785 Hyperlipidemia, unspecified: Secondary | ICD-10-CM | POA: Diagnosis not present

## 2015-07-20 DIAGNOSIS — R6510 Systemic inflammatory response syndrome (SIRS) of non-infectious origin without acute organ dysfunction: Principal | ICD-10-CM | POA: Diagnosis present

## 2015-07-20 DIAGNOSIS — E860 Dehydration: Secondary | ICD-10-CM | POA: Diagnosis present

## 2015-07-20 DIAGNOSIS — Z8489 Family history of other specified conditions: Secondary | ICD-10-CM | POA: Insufficient documentation

## 2015-07-20 DIAGNOSIS — E669 Obesity, unspecified: Secondary | ICD-10-CM | POA: Diagnosis not present

## 2015-07-20 DIAGNOSIS — E039 Hypothyroidism, unspecified: Secondary | ICD-10-CM | POA: Diagnosis not present

## 2015-07-20 DIAGNOSIS — K529 Noninfective gastroenteritis and colitis, unspecified: Secondary | ICD-10-CM | POA: Diagnosis not present

## 2015-07-20 DIAGNOSIS — Z7982 Long term (current) use of aspirin: Secondary | ICD-10-CM | POA: Insufficient documentation

## 2015-07-20 DIAGNOSIS — R112 Nausea with vomiting, unspecified: Secondary | ICD-10-CM | POA: Insufficient documentation

## 2015-07-20 DIAGNOSIS — Z79899 Other long term (current) drug therapy: Secondary | ICD-10-CM | POA: Insufficient documentation

## 2015-07-20 DIAGNOSIS — R509 Fever, unspecified: Secondary | ICD-10-CM

## 2015-07-20 DIAGNOSIS — Z90710 Acquired absence of both cervix and uterus: Secondary | ICD-10-CM | POA: Insufficient documentation

## 2015-07-20 DIAGNOSIS — R51 Headache: Secondary | ICD-10-CM | POA: Insufficient documentation

## 2015-07-20 DIAGNOSIS — Z885 Allergy status to narcotic agent status: Secondary | ICD-10-CM | POA: Insufficient documentation

## 2015-07-20 DIAGNOSIS — J069 Acute upper respiratory infection, unspecified: Secondary | ICD-10-CM | POA: Diagnosis present

## 2015-07-20 DIAGNOSIS — Z880 Allergy status to penicillin: Secondary | ICD-10-CM | POA: Insufficient documentation

## 2015-07-20 DIAGNOSIS — R531 Weakness: Secondary | ICD-10-CM | POA: Diagnosis not present

## 2015-07-20 DIAGNOSIS — Z8249 Family history of ischemic heart disease and other diseases of the circulatory system: Secondary | ICD-10-CM | POA: Insufficient documentation

## 2015-07-20 DIAGNOSIS — Z9049 Acquired absence of other specified parts of digestive tract: Secondary | ICD-10-CM | POA: Diagnosis not present

## 2015-07-20 LAB — CBC
HEMATOCRIT: 42.5 % (ref 35.0–47.0)
Hemoglobin: 14.5 g/dL (ref 12.0–16.0)
MCH: 28.6 pg (ref 26.0–34.0)
MCHC: 34.2 g/dL (ref 32.0–36.0)
MCV: 83.7 fL (ref 80.0–100.0)
Platelets: 185 10*3/uL (ref 150–440)
RBC: 5.07 MIL/uL (ref 3.80–5.20)
RDW: 14.1 % (ref 11.5–14.5)
WBC: 16.1 10*3/uL — ABNORMAL HIGH (ref 3.6–11.0)

## 2015-07-20 LAB — COMPREHENSIVE METABOLIC PANEL
ALBUMIN: 4.6 g/dL (ref 3.5–5.0)
ALK PHOS: 74 U/L (ref 38–126)
ALT: 28 U/L (ref 14–54)
AST: 26 U/L (ref 15–41)
Anion gap: 11 (ref 5–15)
BUN: 9 mg/dL (ref 6–20)
CO2: 23 mmol/L (ref 22–32)
Calcium: 9.3 mg/dL (ref 8.9–10.3)
Chloride: 103 mmol/L (ref 101–111)
Creatinine, Ser: 0.57 mg/dL (ref 0.44–1.00)
GFR calc Af Amer: >60 mL/min (ref >60)
GFR calc non Af Amer: >60 mL/min (ref >60)
GLUCOSE: 153 mg/dL — AB (ref 65–99)
POTASSIUM: 3.8 mmol/L (ref 3.5–5.1)
Sodium: 137 mmol/L (ref 135–145)
Total Bilirubin: 0.9 mg/dL (ref 0.3–1.2)
Total Protein: 7.9 g/dL (ref 6.5–8.1)

## 2015-07-20 LAB — RAPID INFLUENZA A&B ANTIGENS (ARMC ONLY)
INFLUENZA A (ARMC): NEGATIVE
INFLUENZA B (ARMC): NEGATIVE

## 2015-07-20 LAB — URINALYSIS COMPLETE WITH MICROSCOPIC (ARMC ONLY)
BACTERIA UA: NONE SEEN
Bilirubin Urine: NEGATIVE
GLUCOSE, UA: NEGATIVE mg/dL
Hgb urine dipstick: NEGATIVE
Leukocytes, UA: NEGATIVE
Nitrite: NEGATIVE
PROTEIN: NEGATIVE mg/dL
RBC / HPF: NONE SEEN RBC/hpf (ref 0–5)
Specific Gravity, Urine: 1.013 (ref 1.005–1.030)
pH: 7 (ref 5.0–8.0)

## 2015-07-20 LAB — LIPASE, BLOOD: Lipase: 24 U/L (ref 11–51)

## 2015-07-20 LAB — LACTIC ACID, PLASMA: Lactic Acid, Venous: 0.8 mmol/L (ref 0.5–2.0)

## 2015-07-20 LAB — TROPONIN I

## 2015-07-20 MED ORDER — VANCOMYCIN HCL IN DEXTROSE 1-5 GM/200ML-% IV SOLN
1000.0000 mg | Freq: Once | INTRAVENOUS | Status: AC
  Administered 2015-07-20: 1000 mg via INTRAVENOUS
  Filled 2015-07-20: qty 200

## 2015-07-20 MED ORDER — HYDROMORPHONE HCL 1 MG/ML IJ SOLN
0.5000 mg | Freq: Once | INTRAMUSCULAR | Status: AC
  Administered 2015-07-20: 18:00:00 via INTRAVENOUS
  Filled 2015-07-20: qty 1

## 2015-07-20 MED ORDER — ACETAMINOPHEN 500 MG PO TABS
1000.0000 mg | ORAL_TABLET | Freq: Once | ORAL | Status: AC
  Administered 2015-07-20: 1000 mg via ORAL

## 2015-07-20 MED ORDER — ACETAMINOPHEN 500 MG PO TABS
ORAL_TABLET | ORAL | Status: AC
  Administered 2015-07-20: 16:00:00
  Filled 2015-07-20: qty 2

## 2015-07-20 MED ORDER — LEVOFLOXACIN IN D5W 750 MG/150ML IV SOLN
750.0000 mg | Freq: Once | INTRAVENOUS | Status: AC
  Administered 2015-07-20: 750 mg via INTRAVENOUS
  Filled 2015-07-20: qty 150

## 2015-07-20 MED ORDER — ONDANSETRON HCL 4 MG/2ML IJ SOLN
4.0000 mg | Freq: Once | INTRAMUSCULAR | Status: AC
  Administered 2015-07-20: 4 mg via INTRAVENOUS
  Filled 2015-07-20: qty 2

## 2015-07-20 MED ORDER — SODIUM CHLORIDE 0.9 % IV BOLUS (SEPSIS)
1000.0000 mL | Freq: Once | INTRAVENOUS | Status: AC
  Administered 2015-07-20: 1000 mL via INTRAVENOUS

## 2015-07-20 MED ORDER — AZTREONAM 2 G IJ SOLR: 2.0000 g | Freq: Once | INTRAMUSCULAR | Status: DC

## 2015-07-20 MED ORDER — ACETAMINOPHEN 325 MG PO TABS
650.0000 mg | ORAL_TABLET | Freq: Once | ORAL | Status: AC
  Administered 2015-07-20: 650 mg via ORAL
  Filled 2015-07-20: qty 2

## 2015-07-20 MED ORDER — ONDANSETRON HCL 4 MG PO TABS: 4.0000 mg | ORAL_TABLET | Freq: Three times a day (TID) | ORAL | Status: DC | PRN

## 2015-07-20 MED ORDER — ONDANSETRON HCL 4 MG/2ML IJ SOLN
4.0000 mg | Freq: Once | INTRAMUSCULAR | Status: AC | PRN
  Administered 2015-07-20: 4 mg via INTRAVENOUS
  Filled 2015-07-20: qty 2

## 2015-07-20 NOTE — ED Notes (Signed)
Pt reports vomiting, headache, generalized weakness, fevers that started yesterday while at work.  Pt arrived via EMS from home.  Vomiting x4. Denies diarrhea.

## 2015-07-20 NOTE — ED Notes (Addendum)
Pt provided with po fluids and crackers for po challenge per md request. Pt states "i really want to go home."

## 2015-07-20 NOTE — ED Provider Notes (Addendum)
[facility] Department Provider Note   ____________________________________________  Time seen: Approximately 5 PM I have reviewed the triage vital signs and the triage nursing note.  HISTORY  Chief Complaint Emesis; Fever; and Headache   Historian Patient  HPI Wendy Clarke is a 55 y.o. female who is here with complaint of sore throat and fever since yesterday. She's also had vomiting and her right-sided and generalized headache. She's vomited 4 times, nonbloody and nonbilious. No diarrhea. History of tonsils removed. History of prior headaches, and this headache feels similar. No neck stiffness or neck pain. She does work at [employer].  Symptoms are moderate. She does have some light sensitivity.    Past Medical History  Diagnosis Date  . Hyperlipidemia   . Obesity   . Hypothyroidism     Patient Active Problem List   Diagnosis Date Noted  . Diarrhea 05/24/2014  . Fatigue 03/23/2014  . Arthralgia 03/23/2014  . Routine general medical examination at a health care facility 09/01/2013  . Obesity (BMI 30-39.9) 09/01/2013  . Anxiety state, unspecified 02/17/2013  . Hyperlipidemia 02/17/2013  . Osteoarthritis, knee 02/17/2013  . Hypothyroidism 11/03/2010    Past Surgical History  Procedure Laterality Date  . Appendectomy    . Ganglion cyst excision    . Braces    . Abdominal hysterectomy      2003  . Esophageal dilation      06/2015    Current Outpatient Rx  Name  Route  Sig  Dispense  Refill  . aspirin EC 81 MG tablet   Oral   Take 81 mg by mouth daily.         Marland Kitchen atorvastatin (LIPITOR) 20 MG tablet   Oral   Take 20 mg by mouth daily at 6 PM.         . levothyroxine (SYNTHROID, LEVOTHROID) 50 MCG tablet   Oral   Take 1 tablet (50 mcg total) by mouth daily before breakfast.   30 tablet   0   . ondansetron (ZOFRAN) 4 MG tablet   Oral   Take 1 tablet (4 mg total) by mouth every 8 (eight) hours as needed for  nausea or vomiting.   10 tablet   0     Allergies Penicillins and Percocet  Family History  Problem Relation Age of Onset  . Heart disease Father   . Diabetes Father   . Diabetes Sister     Social History Social History  Substance Use Topics  . Smoking status: Former Smoker -- 0.25 packs/day    Types: Cigarettes  . Smokeless tobacco: Never Used  . Alcohol Use: No    Review of Systems  Constitutional: Positive for fever. Eyes: Photophobia. ENT: Positive for sore throat. Cardiovascular: Negative for chest pain. Respiratory: Negative for shortness of breath. Possible mild dry cough. Gastrointestinal: Negative for abdominal pain, vomiting and diarrhea. Genitourinary: Negative for dysuria. Musculoskeletal: Negative for back pain. Skin: Negative for rash. Neurological: Positive for headache. 10 point Review of Systems otherwise negative ____________________________________________   PHYSICAL EXAM:  VITAL SIGNS: ED Triage Vitals  Enc Vitals Group     BP 07/20/15 1440 140/68 mmHg     Pulse Rate 07/20/15 1440 132     Resp 07/20/15 1440 18     Temp 07/20/15 1440 101.1 F (38.4 C)     Temp Source 07/20/15 1440 Oral     SpO2 07/20/15 1440 96 %     Weight 07/20/15 1440 200 lb (  90.719 kg)     Height 07/20/15 1440 5\' 2"  (1.575 m)     Head Cir --      Peak Flow --      Pain Score 07/20/15 1440 7     Pain Loc --      Pain Edu? --      Excl. in GC? --      Constitutional: Alert and oriented. Well appearing and in no distress. HEENT   Head: Normocephalic and atraumatic.      Eyes: Conjunctivae are normal. PERRL. Normal extraocular movements.      Ears:         Nose: No congestion/rhinnorhea.   Mouth/Throat: Mucous membranes are moist.Erythema to the posterior oropharynx. No tonsils.   Neck: No stridor.No neck stiffness. Cardiovascular/Chest: Normal rate, regular rhythm.  No murmurs, rubs, or gallops. Respiratory: Normal respiratory effort without  tachypnea nor retractions. Breath sounds are clear and equal bilaterally. No wheezes/rales/rhonchi. Gastrointestinal: Soft. No distention, no guarding, no rebound. Nontender.    Genitourinary/rectal:Deferred Musculoskeletal: Nontender with normal range of motion in all extremities. No joint effusions.  No lower extremity tenderness.  No edema. Neurologic:  Normal speech and language. No gross or focal neurologic deficits are appreciated. Skin:  Skin is warm, dry and intact. No rash noted. Psychiatric: Mood and affect are normal. Speech and behavior are normal. Patient exhibits appropriate insight and judgment.  ____________________________________________   EKG I, Governor Rooksebecca Taylon Coole, MD, the attending physician have personally viewed and interpreted all ECGs.  95 bpm.  normal sinus rhythm. Narrow QRS. Nonspecific T-wave ____________________________________________  LABS (pertinent positives/negatives)  Urinalysis trace ketones otherwise negative Lipase 24 Comprehensive metabolic panel without significant abnormality White blood cell count 16.1, hemoglobin 14.5 and platelets 185  Rapid strep negative Rapid influenza A and B negative  ____________________________________________  RADIOLOGY All Xrays were viewed by me. Imaging interpreted by Radiologist.  None __________________________________________  PROCEDURES  Procedure(s) performed: None  Critical Care performed: None  ____________________________________________   ED COURSE / ASSESSMENT AND PLAN  Pertinent labs & imaging results that were available during my care of the patient were reviewed by me and considered in my medical decision making (see chart for details).   The constellation of patient's symptoms seemed consistent with a likely viral syndrome.  In terms of a headache, no high risk/red flag features to make me concern for intracranial emergency. I discussed this with the patient and she agrees that her  headache is similar to priors.  Rapid strep and influenza testing are negative.  After symptomatic treatment, patient feels much better. I think it is okay for her to discharge home.  ----------------------------------------- 7:40 PM on 07/20/2015 -----------------------------------------  The patient's blood pressure was a little low around systolic 95.  She is given additional bolus  Patient did receive additional bolus and even after total of 3 L normal saline, her blood pressure is still about a systolic of 95-100. I think it is prudent at this point time to send blood cultures and go ahead and treat her for the possibly of sepsis with elevated white blood cell count and low blood pressure.  Due to patient with penicillin allergy, and unknown source, protocol recommended Levaquin, aztreonam, and vancomycin.   11pm -- Discussed with ICU physician Dr. Belia HemanKasa, pt not meet criteria for icu admission -- next bp 109 systolic.  I discussed with Dr. Anne HahnWillis for hospital observation after low blood pressures in setting of fever and elevated wbc and persistent after 3l ns bolus.  CONSULTATIONS:   Hospitalist for admission.   Patient / Family / Caregiver informed of clinical course, medical decision-making process, and agree with plan.  ___________________________________________   FINAL CLINICAL IMPRESSION(S) / ED DIAGNOSES   Final diagnoses:  Nausea and vomiting, vomiting of unspecified type  Fever, unspecified fever cause              Note: This dictation was prepared with Dragon dictation. Any transcriptional errors that result from this process are unintentional   Governor Rooks, MD 07/20/15 1929  Governor Rooks, MD 07/20/15 [phone]

## 2015-07-20 NOTE — ED Notes (Signed)
Spoke with Dr. Sharma CovertNorman about pt tachycardia and elevated temp.  New order for NaCl bolus at this time.

## 2015-07-20 NOTE — Consult Note (Signed)
[facility] Wendy Clarke      Date: 07/20/2015,   MRN# [id number] Wendy Clarke December 22, 1960 Code Status:  Code Status History    This patient does not have a recorded code status. Please follow your organizational policy for patients in this situation.    Advance Directive Documentation        Most Recent Value   Type of Advance Directive  Healthcare Power of Attorney, Living will   Pre-existing out of facility DNR order (yellow form or pink MOST form)     "MOST" Form in Place?       Hosp day:@LENGTHOFSTAYDAYS @ Referring MD: @ATDPROV @     PCP:      AdmissionWeight: 200 lb (90.719 kg)                 CurrentWeight: 200 lb (90.719 kg) Wendy Clarke is a 55 y.o. old female seen in [place] for vomiting at request DR. Lord     CHIEF COMPLAINT:   Vomiting and weakness   HISTORY OF PRESENT ILLNESS   55 yo white female seen for low blood pressure along with symptoms of vomiting x4, she states it was bile,  no blood,associated with generalized weakness, she was able to ambulate to bedside commode,  no symptoms of orthostasis,   patient also had sore throat which has resolved, denies SOB, chest pain, abd pain.  No GIB, Patient was given 3 L NS,  Blood pressure was 109/68, HR 90, RR 12 o2sat 98% on RA NAD  LA was 0.8  Patient feels better, but still weak   PAST MEDICAL HISTORY   Past Medical History  Diagnosis Date  . Hyperlipidemia   . Obesity   . Hypothyroidism      SURGICAL HISTORY   Past Surgical History  Procedure Laterality Date  . Appendectomy    . Ganglion cyst excision    . Braces    . Abdominal hysterectomy      2003  . Esophageal dilation      06/2015     FAMILY HISTORY   Family History  Problem Relation Age of Onset  . Heart disease Father   . Diabetes Father   . Diabetes Sister      SOCIAL HISTORY   Social History  Substance Use Topics  . Smoking status: Former Smoker -- 0.25 packs/day    Types:  Cigarettes  . Smokeless tobacco: Never Used  . Alcohol Use: No     MEDICATIONS    Home Medication:  Current Outpatient Rx  Name  Route  Sig  Dispense  Refill  . aspirin EC 81 MG tablet   Oral   Take 81 mg by mouth daily.         Marland Kitchen atorvastatin (LIPITOR) 20 MG tablet   Oral   Take 20 mg by mouth daily at 6 PM.         . levothyroxine (SYNTHROID, LEVOTHROID) 50 MCG tablet   Oral   Take 1 tablet (50 mcg total) by mouth daily before breakfast.   30 tablet   0   . ondansetron (ZOFRAN) 4 MG tablet   Oral   Take 1 tablet (4 mg total) by mouth every 8 (eight) hours as needed for nausea or vomiting.   10 tablet   0     Current Medication:  Current facility-administered medications:  .  aztreonam (AZACTAM) 2 g in dextrose 5 % 50 mL IVPB, 2 g, Intravenous, Once, Governor Rooks, MD .  levofloxacin (LEVAQUIN) IVPB 750 mg, 750 mg, Intravenous, Once, Governor Rooks, MD .  vancomycin (VANCOCIN) IVPB 1000 mg/200 mL premix, 1,000 mg, Intravenous, Once, Governor Rooks, MD  Current outpatient prescriptions:  .  aspirin EC 81 MG tablet, Take 81 mg by mouth daily., Disp: , Rfl:  .  atorvastatin (LIPITOR) 20 MG tablet, Take 20 mg by mouth daily at 6 PM., Disp: , Rfl:  .  levothyroxine (SYNTHROID, LEVOTHROID) 50 MCG tablet, Take 1 tablet (50 mcg total) by mouth daily before breakfast., Disp: 30 tablet, Rfl: 0 .  ondansetron (ZOFRAN) 4 MG tablet, Take 1 tablet (4 mg total) by mouth every 8 (eight) hours as needed for nausea or vomiting., Disp: 10 tablet, Rfl: 0    ALLERGIES   Penicillins and Percocet     REVIEW OF SYSTEMS   Review of Systems  Constitutional: Positive for fever and malaise/fatigue. Negative for chills, weight loss and diaphoresis.  HENT: Positive for congestion. Negative for hearing loss.   Eyes: Negative for blurred vision and double vision.  Respiratory: Negative for cough, hemoptysis, sputum production, shortness of breath and wheezing.   Cardiovascular: Negative  for chest pain, palpitations and leg swelling.  Gastrointestinal: Positive for vomiting. Negative for heartburn, nausea and abdominal pain.  Genitourinary: Negative for dysuria and urgency.  Musculoskeletal: Negative for myalgias and neck pain.  Neurological: Positive for headaches. Negative for dizziness, tingling and weakness.  Psychiatric/Behavioral: Negative for depression. The patient is not nervous/anxious.   All other systems reviewed and are negative.    VS: BP 88/74 mmHg  Pulse 89  Temp(Src) 98 F (36.7 C) (Oral)  Resp 13  Ht  (1.575 m)  Wt 200 lb (90.719 kg)  BMI 36.57 kg/m2  SpO2 98%     PHYSICAL EXAM  Physical Exam  Constitutional: She is oriented to person, place, and time. She appears well-developed and well-nourished. No distress.  HENT:  Head: Normocephalic and atraumatic.  Mouth/Throat: No oropharyngeal exudate.  Eyes: EOM are normal. Pupils are equal, round, and reactive to light. No scleral icterus.  Neck: Normal range of motion. Neck supple.  Cardiovascular: Normal rate, regular rhythm and normal heart sounds.   No murmur heard. Pulmonary/Chest: No stridor. No respiratory distress. She has no wheezes. She has no rales.  Abdominal: Soft. Bowel sounds are normal. She exhibits no distension. There is no tenderness. There is no rebound.  Musculoskeletal: Normal range of motion. She exhibits no edema.  Neurological: She is alert and oriented to person, place, and time. She displays normal reflexes. Coordination normal.  Skin: Skin is warm. She is not diaphoretic.  Psychiatric: She has a normal mood and affect.        LABS    Recent Labs     07/20/15  1448  HGB  14.5  HCT  42.5  MCV  83.7  WBC  16.1*  BUN  9  CREATININE  0.57  GLUCOSE  153*  CALCIUM  9.3  ,       CULTURE RESULTS   Recent Results (from the past 240 hour(s))  Rapid Influenza A&B Antigens ([facility] only)     Status: None   Collection Time: 07/20/15  6:14 PM  Result Value  Ref Range Status   Influenza A ([facility]) NEGATIVE NEGATIVE Final   Influenza B [facility]) NEGATIVE NEGATIVE Final          IMAGING    Dg Chest Port 1 View  07/20/2015  CLINICAL DATA:  Fever and cough, onset yesterday. EXAM:  PORTABLE CHEST 1 VIEW COMPARISON:  None. FINDINGS: A single AP portable view of the chest demonstrates no focal airspace consolidation or alveolar edema. The lungs are grossly clear. There is no large effusion or pneumothorax. Cardiac and mediastinal contours appear unremarkable. IMPRESSION: No active disease. Electronically Signed   By: Ellery Plunkaniel R Mitchell M.D.   On: 07/20/2015 22:34         ASSESSMENT/PLAN   55 yo white female with signs and symptoms of Viral gastroenteritis with intermittent low BP with hypovolumia responding to IVF's  1.continue IV and oral hydration as tolerated, zofran as needed 2.no indication for ABX at this time   Patient does NOT meet ICU Criteria   I have personally obtained a history, examined the patient, evaluated laboratory and independently reviewed imaging results, formulated the assessment and plan and placed orders.  The Patient requires high complexity decision making for assessment and support, frequent evaluation and titration of therapies, application of advanced monitoring technologies and extensive interpretation of multiple databases.    Patient/Family are satisfied with Plan of action and management. All questions answered  Lucie LeatherKurian David Bailea Beed, M.D.  [facility]  Medical Director [facility] Medical Director [facility]

## 2015-07-20 NOTE — ED Notes (Signed)
Pt reports feeling improved. Pt with hypotension, skin pwd, resps unlabored. Dr. Shaune PollackLord in to reassess.

## 2015-07-20 NOTE — ED Notes (Signed)
Blood pressure 88/74. Dr. Shaune PollackLord notified. Orders for antibiotics previously received.

## 2015-07-20 NOTE — ED Notes (Signed)
Attempt int initiation x2 by susan neal, rn and this rn without success. Danelle EarthlyNoel, rn to attempt ultrasound int initiation. Pt assisted up to restroom to void. Pt continues to report feeling improved.

## 2015-07-20 NOTE — ED Notes (Signed)
Pt sleeping. 

## 2015-07-20 NOTE — ED Notes (Signed)
Pt complains of headache, requesting tylenol. Order from md received. Dr. Shaune PollackLord notified pt has had 3 int attempts without success. md states is ok to stop attempting additional iv access at this time.

## 2015-07-21 LAB — BASIC METABOLIC PANEL
Anion gap: 9 (ref 5–15)
BUN: 7 mg/dL (ref 6–20)
CHLORIDE: 107 mmol/L (ref 101–111)
CO2: 22 mmol/L (ref 22–32)
Calcium: 7.7 mg/dL — ABNORMAL LOW (ref 8.9–10.3)
Creatinine, Ser: 0.62 mg/dL (ref 0.44–1.00)
GFR calc Af Amer: >60 mL/min (ref >60)
GFR calc non Af Amer: >60 mL/min (ref >60)
GLUCOSE: 123 mg/dL — AB (ref 65–99)
POTASSIUM: 3.2 mmol/L — AB (ref 3.5–5.1)
Sodium: 138 mmol/L (ref 135–145)

## 2015-07-21 LAB — CBC
HEMATOCRIT: 33.7 % — AB (ref 35.0–47.0)
Hemoglobin: 11.9 g/dL — ABNORMAL LOW (ref 12.0–16.0)
MCH: 29.2 pg (ref 26.0–34.0)
MCHC: 35.3 g/dL (ref 32.0–36.0)
MCV: 82.8 fL (ref 80.0–100.0)
Platelets: 137 10*3/uL — ABNORMAL LOW (ref 150–440)
RBC: 4.07 MIL/uL (ref 3.80–5.20)
RDW: 14 % (ref 11.5–14.5)
WBC: 10.5 10*3/uL (ref 3.6–11.0)

## 2015-07-21 LAB — MAGNESIUM: Magnesium: 1.6 mg/dL — ABNORMAL LOW (ref 1.7–2.4)

## 2015-07-21 MED ORDER — LEVOTHYROXINE SODIUM 50 MCG PO TABS
50.0000 ug | ORAL_TABLET | Freq: Every day | ORAL | Status: DC
  Administered 2015-07-21: 50 ug via ORAL
  Filled 2015-07-21: qty 1

## 2015-07-21 MED ORDER — SODIUM CHLORIDE 0.9% FLUSH: 3.0000 mL | Freq: Two times a day (BID) | INTRAVENOUS | Status: DC

## 2015-07-21 MED ORDER — ONDANSETRON HCL 4 MG/2ML IJ SOLN: 4.0000 mg | Freq: Four times a day (QID) | INTRAMUSCULAR | Status: DC | PRN

## 2015-07-21 MED ORDER — ONDANSETRON HCL 4 MG PO TABS: 4.0000 mg | ORAL_TABLET | Freq: Four times a day (QID) | ORAL | Status: DC | PRN

## 2015-07-21 MED ORDER — MAGNESIUM SULFATE 2 GM/50ML IV SOLN
2.0000 g | Freq: Once | INTRAVENOUS | Status: DC
  Filled 2015-07-21: qty 50

## 2015-07-21 MED ORDER — IBUPROFEN 400 MG PO TABS
400.0000 mg | ORAL_TABLET | Freq: Four times a day (QID) | ORAL | Status: DC | PRN
  Administered 2015-07-21: 400 mg via ORAL
  Filled 2015-07-21 (×2): qty 1

## 2015-07-21 MED ORDER — FLUTICASONE PROPIONATE 50 MCG/ACT NA SUSP
1.0000 | Freq: Every day | NASAL | Status: DC
  Administered 2015-07-21: 1 via NASAL
  Filled 2015-07-21: qty 16

## 2015-07-21 MED ORDER — SODIUM CHLORIDE 0.9 % IV SOLN
INTRAVENOUS | Status: DC
  Administered 2015-07-21: 02:00:00 via INTRAVENOUS

## 2015-07-21 MED ORDER — MAGNESIUM OXIDE 400 (241.3 MG) MG PO TABS
800.0000 mg | ORAL_TABLET | Freq: Once | ORAL | Status: AC
  Administered 2015-07-21: 800 mg via ORAL
  Filled 2015-07-21: qty 2

## 2015-07-21 MED ORDER — ENOXAPARIN SODIUM 40 MG/0.4ML ~~LOC~~ SOLN: 40.0000 mg | SUBCUTANEOUS | Status: DC

## 2015-07-21 MED ORDER — POTASSIUM CHLORIDE CRYS ER 20 MEQ PO TBCR
40.0000 meq | EXTENDED_RELEASE_TABLET | Freq: Once | ORAL | Status: AC
  Administered 2015-07-21: 40 meq via ORAL
  Filled 2015-07-21: qty 2

## 2015-07-21 MED ORDER — ACETAMINOPHEN 650 MG RE SUPP: 650.0000 mg | Freq: Four times a day (QID) | RECTAL | Status: DC | PRN

## 2015-07-21 MED ORDER — ACETAMINOPHEN 325 MG PO TABS: 650.0000 mg | ORAL_TABLET | Freq: Four times a day (QID) | ORAL | Status: DC | PRN

## 2015-07-21 MED ORDER — ASPIRIN EC 81 MG PO TBEC
81.0000 mg | DELAYED_RELEASE_TABLET | Freq: Every day | ORAL | Status: DC
  Administered 2015-07-21: 81 mg via ORAL
  Filled 2015-07-21: qty 1

## 2015-07-21 MED ORDER — ATORVASTATIN CALCIUM 20 MG PO TABS: 20.0000 mg | ORAL_TABLET | Freq: Every day | ORAL | Status: DC

## 2015-07-21 NOTE — Discharge Summary (Signed)
[facility] - [place] at [facility]   PATIENT NAME: Wendy Clarke    MR#:  [id number]  DATE OF BIRTH:  Aug 01, 1960  DATE OF ADMISSION:  07/20/2015 ADMITTING PHYSICIAN: Oralia Manis, MD  DATE OF DISCHARGE: 07/21/2015 11:22 AM  PRIMARY CARE PHYSICIAN: No PCP Per Patient    ADMISSION DIAGNOSIS:  Fever, unspecified fever cause [R50.9] Nausea and vomiting, vomiting of unspecified type [R11.2]   DISCHARGE DIAGNOSIS:  SIRS URI Dehydration SECONDARY DIAGNOSIS:   Past Medical History  Diagnosis Date  . Hyperlipidemia   . Obesity   . Hypothyroidism     HOSPITAL COURSE:   SIRS (systemic inflammatory response syndrome) (HCC) - suspect this is largely due to significant dehydration with her vomiting. Unclear etiology of her infection other than strong suspicion of viral URI/GI infection.  Dehydration, She was treated with IV for hydration  URI (upper respiratory infection) - supportive treatment, patient stated that she felt that her throat was no longer sore but just "full". On exam there was no evidence of any sort of abscess. Her symptoms improved.  Hypothyroidism - home dose thyroid replacement  DISCHARGE CONDITIONS:   Stable, discharged to home today.  CONSULTS OBTAINED:     DRUG ALLERGIES:   Allergies  Allergen Reactions  . Penicillins Anaphylaxis and Other (See Comments)    Has patient had a PCN reaction causing immediate rash, facial/tongue/throat swelling, SOB or lightheadedness with hypotension: Yes Has patient had a PCN reaction causing severe rash involving mucus membranes or skin necrosis: No Has patient had a PCN reaction that required hospitalization No Has patient had a PCN reaction occurring within the last 10 years: No If all of the above answers are "NO", then may proceed with Cephalosporin use.   Marland Kitchen Percocet [Oxycodone-Acetaminophen] Nausea And Vomiting    DISCHARGE MEDICATIONS:   Discharge Medication List as of 07/21/2015 10:39 AM     START taking these medications   Details  ondansetron (ZOFRAN) 4 MG tablet Take 1 tablet (4 mg total) by mouth every 8 (eight) hours as needed for nausea or vomiting., Starting 07/20/2015, Until Discontinued, Print      CONTINUE these medications which have NOT CHANGED   Details  aspirin EC 81 MG tablet Take 81 mg by mouth daily., Until Discontinued, Historical Med    atorvastatin (LIPITOR) 20 MG tablet Take 20 mg by mouth daily at 6 PM., Until Discontinued, Historical Med    levothyroxine (SYNTHROID, LEVOTHROID) 50 MCG tablet Take 1 tablet (50 mcg total) by mouth daily before breakfast., Starting 04/10/2015, Until Discontinued, Normal         DISCHARGE INSTRUCTIONS:    If you experience worsening of your admission symptoms, develop shortness of breath, life threatening emergency, suicidal or homicidal thoughts you must seek medical attention immediately by calling 911 or calling your MD immediately  if symptoms less severe.  You Must read complete instructions/literature along with all the possible adverse reactions/side effects for all the Medicines you take and that have been prescribed to you. Take any new Medicines after you have completely understood and accept all the possible adverse reactions/side effects.   Please note  You were cared for by a hospitalist during your hospital stay. If you have any questions about your discharge medications or the care you received while you were in the hospital after you are discharged, you can call the unit and asked to speak with the hospitalist on call if the hospitalist that took care of you is not available. Once  you are discharged, your primary care physician will handle any further medical issues. Please note that NO REFILLS for any discharge medications will be authorized once you are discharged, as it is imperative that you return to your primary care physician (or establish a relationship with a primary care physician if you do not  have one) for your aftercare needs so that they can reassess your need for medications and monitor your lab values.    Today   SUBJECTIVE   No complaint   VITAL SIGNS:  Blood pressure 102/58, pulse 91, temperature 98.2 F (36.8 C), temperature source Oral, resp. rate 20, height 5\' 2"  (1.575 m), weight 90.719 kg (200 lb), SpO2 96 %.  I/O:   Intake/Output Summary (Last 24 hours) at 07/21/15 1404 Last data filed at 07/21/15 1047  Gross per 24 hour  Intake    879 ml  Output    550 ml  Net    329 ml    PHYSICAL EXAMINATION:  GENERAL:  55 y.o.-year-old patient lying in the bed with no acute distress. Obese. EYES: Pupils equal, round, reactive to light and accommodation. No scleral icterus. Extraocular muscles intact.  HEENT: Head atraumatic, normocephalic. Oropharynx and nasopharynx clear.  NECK:  Supple, no jugular venous distention. No thyroid enlargement, no tenderness.  LUNGS: Normal breath sounds bilaterally, no wheezing, rales,rhonchi or crepitation. No use of accessory muscles of respiration.  CARDIOVASCULAR: S1, S2 normal. No murmurs, rubs, or gallops.  ABDOMEN: Soft, non-tender, non-distended. Bowel sounds present. No organomegaly or mass.  EXTREMITIES: No pedal edema, cyanosis, or clubbing.  NEUROLOGIC: Cranial nerves II through XII are intact. Muscle strength 5/5 in all extremities. Sensation intact. Gait not checked.  PSYCHIATRIC: The patient is alert and oriented x 3.  SKIN: No obvious rash, lesion, or ulcer.   DATA REVIEW:   CBC  Recent Labs Lab 07/21/15 0624  WBC 10.5  HGB 11.9*  HCT 33.7*  PLT 137*    Chemistries   Recent Labs Lab 07/20/15 1448 07/21/15 0624  NA 137 138  K 3.8 3.2*  CL 103 107  CO2 23 22  GLUCOSE 153* 123*  BUN 9 7  CREATININE 0.57 0.62  CALCIUM 9.3 7.7*  MG  --  1.6*  AST 26  --   ALT 28  --   ALKPHOS 74  --   BILITOT 0.9  --     Cardiac Enzymes  Recent Labs Lab 07/20/15 1448  TROPONINI <0.03    Microbiology  Results  Results for orders placed or performed during the hospital encounter of 07/20/15  Rapid Influenza A&B Antigens ([facility] only)     Status: None   Collection Time: 07/20/15  6:14 PM  Result Value Ref Range Status   Influenza A ([facility]) NEGATIVE NEGATIVE Final   Influenza B [facility]) NEGATIVE NEGATIVE Final    RADIOLOGY:  Dg Chest Port 1 View  07/20/2015  CLINICAL DATA:  Fever and cough, onset yesterday. EXAM: PORTABLE CHEST 1 VIEW COMPARISON:  None. FINDINGS: A single AP portable view of the chest demonstrates no focal airspace consolidation or alveolar edema. The lungs are grossly clear. There is no large effusion or pneumothorax. Cardiac and mediastinal contours appear unremarkable. IMPRESSION: No active disease. Electronically Signed   By: Ellery Plunk M.D.   On: 07/20/2015 22:34        Management plans discussed with the patient, her husband and they are in agreement.  CODE STATUS:     Code Status Orders  Start     Ordered   07/21/15 0141  Full code   Continuous     07/21/15 0140    Code Status History    Date Active Date Inactive Code Status Order ID Comments User Context   This patient has a current code status but no historical code status.    Advance Directive Documentation        Most Recent Value   Type of Advance Directive  Living will   Pre-existing out of facility DNR order (yellow form or pink MOST form)     "MOST" Form in Place?        TOTAL TIME TAKING CARE OF THIS PATIENT: 25 minutes.    Shaune Pollackhen, Truett Mcfarlan M.D on 07/21/2015 at 2:04 PM  Between 7am to 6pm - Pager - [phone]  After 6pm go to [email or web address] - password [email or web address] [facility]  [facility]  Office  [phone]  CC: Primary care physician; No PCP Per Patient

## 2015-07-21 NOTE — H&P (Signed)
[facility] - [place] at [facility]   PATIENT NAME: Wendy Clarke    MR#:  [id number]  DATE OF BIRTH:  26-Jul-1960  DATE OF ADMISSION:  07/20/2015  PRIMARY CARE PHYSICIAN: No PCP Per Patient   REQUESTING/REFERRING PHYSICIAN: Shaune Pollack, MD  CHIEF COMPLAINT:   Chief Complaint  Patient presents with  . Emesis  . Fever  . Headache    HISTORY OF PRESENT ILLNESS:  Wendy Clarke  is a 55 y.o. female who presents with 2 days malaise. Patient initially had a sore throat and she woke up in the morning yesterday morning, and has had malaise since that time. Earlier today she developed nausea with vomiting. Vomited total 4 times. Here in the ED she was found to meet SIRS criteria. She was initially tachycardic, and had a white blood cell count of 16. However, further workup did not elucidate any source of infection. Chest x-ray was clear, urine culture did not look suspicious for infection. Blood cultures sent and IV antibiotics given. Patient's blood pressure was initially on the low end, and was slow to correct despite 3 L of IV fluids in the ED. Hospitalists were called for further evaluation and potential admission for observation.  PAST MEDICAL HISTORY:   Past Medical History  Diagnosis Date  . Hyperlipidemia   . Obesity   . Hypothyroidism     PAST SURGICAL HISTORY:   Past Surgical History  Procedure Laterality Date  . Appendectomy    . Ganglion cyst excision    . Braces    . Abdominal hysterectomy      2003  . Esophageal dilation      06/2015    SOCIAL HISTORY:   Social History  Substance Use Topics  . Smoking status: Former Smoker -- 0.25 packs/day    Types: Cigarettes  . Smokeless tobacco: Never Used  . Alcohol Use: No    FAMILY HISTORY:   Family History  Problem Relation Age of Onset  . Heart disease Father   . Diabetes Father   . Diabetes Sister     DRUG ALLERGIES:   Allergies  Allergen Reactions  . Penicillins Anaphylaxis and Other (See  Comments)    Has patient had a PCN reaction causing immediate rash, facial/tongue/throat swelling, SOB or lightheadedness with hypotension: Yes Has patient had a PCN reaction causing severe rash involving mucus membranes or skin necrosis: No Has patient had a PCN reaction that required hospitalization No Has patient had a PCN reaction occurring within the last 10 years: No If all of the above answers are "NO", then may proceed with Cephalosporin use.   Marland Kitchen Percocet [Oxycodone-Acetaminophen] Nausea And Vomiting    MEDICATIONS AT HOME:   Prior to Admission medications   Medication Sig Start Date End Date Taking? Authorizing Provider  aspirin EC 81 MG tablet Take 81 mg by mouth daily.   Yes Historical Provider, MD  atorvastatin (LIPITOR) 20 MG tablet Take 20 mg by mouth daily at 6 PM.   Yes Historical Provider, MD  levothyroxine (SYNTHROID, LEVOTHROID) 50 MCG tablet Take 1 tablet (50 mcg total) by mouth daily before breakfast. 04/10/15  Yes Shelia Media, MD  ondansetron (ZOFRAN) 4 MG tablet Take 1 tablet (4 mg total) by mouth every 8 (eight) hours as needed for nausea or vomiting. 07/20/15   Governor Rooks, MD    REVIEW OF SYSTEMS:  Review of Systems  Constitutional: Positive for malaise/fatigue. Negative for fever, chills and weight loss.  HENT: Positive for congestion and  sore throat. Negative for ear pain, hearing loss and tinnitus.   Eyes: Negative for blurred vision, double vision, pain and redness.  Respiratory: Negative for cough, hemoptysis and shortness of breath.   Cardiovascular: Negative for chest pain, palpitations, orthopnea and leg swelling.  Gastrointestinal: Positive for nausea and vomiting. Negative for abdominal pain, diarrhea and constipation.  Genitourinary: Negative for dysuria, frequency and hematuria.  Musculoskeletal: Negative for back pain, joint pain and neck pain.  Skin:       No acne, rash, or lesions  Neurological: Positive for weakness. Negative for  dizziness, tremors and focal weakness.  Endo/Heme/Allergies: Negative for polydipsia. Does not bruise/bleed easily.  Psychiatric/Behavioral: Negative for depression. The patient is not nervous/anxious and does not have insomnia.      VITAL SIGNS:   Filed Vitals:   07/20/15 2240 07/20/15 2300 07/20/15 2315 07/20/15 2334  BP: 109/66   112/63  Pulse: 88 88 88   Temp:      TempSrc:      Resp: 18 20 16    Height:      Weight:      SpO2: 96% 97% 98%    Wt Readings from Last 3 Encounters:  07/20/15 90.719 kg (200 lb)  04/10/15 92.647 kg (204 lb 4 oz)  05/24/14 90.833 kg (200 lb 4 oz)    PHYSICAL EXAMINATION:  Physical Exam  Vitals reviewed. Constitutional: She is oriented to person, place, and time. She appears well-developed and well-nourished. No distress.  HENT:  Head: Normocephalic and atraumatic.  Dry mucous membranes  Eyes: Conjunctivae and EOM are normal. Pupils are equal, round, and reactive to light. No scleral icterus.  Neck: Normal range of motion. Neck supple. No JVD present. No thyromegaly present.  Cardiovascular: Normal rate, regular rhythm and intact distal pulses.  Exam reveals no gallop and no friction rub.   No murmur heard. Respiratory: Effort normal and breath sounds normal. No respiratory distress. She has no wheezes. She has no rales.  GI: Soft. Bowel sounds are normal. She exhibits no distension. There is no tenderness.  Musculoskeletal: Normal range of motion. She exhibits no edema.  No arthritis, no gout  Lymphadenopathy:    She has no cervical adenopathy.  Neurological: She is alert and oriented to person, place, and time. No cranial nerve deficit.  No dysarthria, no aphasia  Skin: Skin is warm and dry. No rash noted. No erythema.  Psychiatric: She has a normal mood and affect. Her behavior is normal. Judgment and thought content normal.    LABORATORY PANEL:   CBC  Recent Labs Lab 07/20/15 1448  WBC 16.1*  HGB 14.5  HCT 42.5  PLT 185    ------------------------------------------------------------------------------------------------------------------  Chemistries   Recent Labs Lab 07/20/15 1448  NA 137  K 3.8  CL 103  CO2 23  GLUCOSE 153*  BUN 9  CREATININE 0.57  CALCIUM 9.3  AST 26  ALT 28  ALKPHOS 74  BILITOT 0.9   ------------------------------------------------------------------------------------------------------------------  Cardiac Enzymes  Recent Labs Lab 07/20/15 1448  TROPONINI <0.03   ------------------------------------------------------------------------------------------------------------------  RADIOLOGY:  Dg Chest Port 1 View  07/20/2015  CLINICAL DATA:  Fever and cough, onset yesterday. EXAM: PORTABLE CHEST 1 VIEW COMPARISON:  None. FINDINGS: A single AP portable view of the chest demonstrates no focal airspace consolidation or alveolar edema. The lungs are grossly clear. There is no large effusion or pneumothorax. Cardiac and mediastinal contours appear unremarkable. IMPRESSION: No active disease. Electronically Signed   By: Ellery Plunkaniel R Mitchell M.D.   On:  07/20/2015 22:34    EKG:   Orders placed or performed during the hospital encounter of 07/20/15  . ED EKG  . ED EKG  . EKG 12-Lead  . EKG 12-Lead    IMPRESSION AND PLAN:  Principal Problem:   SIRS (systemic inflammatory response syndrome) (HCC) - suspect this is largely due to significant dehydration with her vomiting. Unclear etiology of her infection other than strong suspicion of viral URI/GI infection. We will admit her for observation, hydrate her with fluids, and reevaluate with am labs. Active Problems:   Dehydration - IV fluids tonight for hydration   URI (upper respiratory infection) - supportive treatment, patient stated that she felt that her throat was no longer sore but just "full". On exam there was no evidence of any sort of abscess.   Hypothyroidism - home dose thyroid replacement  All the records are reviewed  and case discussed with ED provider. Management plans discussed with the patient and/or family.  DVT PROPHYLAXIS: SubQ lovenox  GI PROPHYLAXIS: None  ADMISSION STATUS: Observation  CODE STATUS: Full Code Status History    This patient does not have a recorded code status. Please follow your organizational policy for patients in this situation.    Advance Directive Documentation        Most Recent Value   Type of Advance Directive  Healthcare Power of Attorney, Living will   Pre-existing out of facility DNR order (yellow form or pink MOST form)     "MOST" Form in Place?        TOTAL TIME TAKING CARE OF THIS PATIENT: 40 minutes.    Wendy Clarke FIELDING 07/21/2015, 12:03 AM  [facility]  Office  [phone]  CC: Primary care physician; No PCP Per Patient

## 2015-07-21 NOTE — Discharge Instructions (Signed)
You were evaluated for fever and congestion and sore throat, headache, as well as nausea and vomiting, and as we discussed no certain cause was found, but I am highly suspicious this is due to a virus.  Examination and evaluation are reassuring in the emergency department today.  Return to the emergency department for any worsening headache, confusion or altered mental status, seizure, passing out, weakness or numbness, chest pain, cough, trouble breathing, or any other symptoms concerning to you.   Fever, Adult A fever is an increase in the body's temperature. It is usually defined as a temperature of 100F (38C) or higher. Brief mild or moderate fevers generally have no long-term effects, and they often do not require treatment. Moderate or high fevers may make you feel uncomfortable and can sometimes be a sign of a serious illness or disease. The sweating that may occur with repeated or prolonged fever may also cause dehydration. Fever is confirmed by taking a temperature with a thermometer. A measured temperature can vary with:  Age.  Time of day.  Location of the thermometer:  Mouth (oral).  Rectum (rectal).  Ear (tympanic).  Underarm (axillary).  Forehead (temporal). HOME CARE INSTRUCTIONS Pay attention to any changes in your symptoms. Take these actions to help with your condition:  Take over-the counter and prescription medicines only as told by your health care provider. Follow the dosing instructions carefully.  If you were prescribed an antibiotic medicine, take it as told by your health care provider. Do not stop taking the antibiotic even if you start to feel better.  Rest as needed.  Drink enough fluid to keep your urine clear or pale yellow. This helps to prevent dehydration.  Sponge yourself or bathe with room-temperature water to help reduce your body temperature as needed. Do not use ice water.  Do not overbundle yourself in blankets or heavy clothes. SEEK  MEDICAL CARE IF:  You vomit.  You cannot eat or drink without vomiting.  You have diarrhea.  You have pain when you urinate.  Your symptoms do not improve with treatment.  You develop new symptoms.  You develop excessive weakness. SEEK IMMEDIATE MEDICAL CARE IF:  You have shortness of breath or have trouble breathing.  You are dizzy or you faint.  You are disoriented or confused.  You develop signs of dehydration, such as a dry mouth, decreased urination, or paleness.  You develop severe pain in your abdomen.  You have persistent vomiting or diarrhea.  You develop a skin rash.  Your symptoms suddenly get worse.   This information is not intended to replace advice given to you by your health care provider. Make sure you discuss any questions you have with your health care provider.   Document Released: 08/26/2000 Document Revised: 11/21/2014 Document Reviewed: 04/26/2014 Elsevier Interactive Patient Education 2016 [employer]  Nausea and Vomiting Nausea means you feel sick to your stomach. Throwing up (vomiting) is a reflex where stomach contents come out of your mouth. HOME CARE   Take medicine as told by your doctor.  Do not force yourself to eat. However, you do need to drink fluids.  If you feel like eating, eat a normal diet as told by your doctor.  Eat rice, wheat, potatoes, bread, lean meats, yogurt, fruits, and vegetables.  Avoid high-fat foods.  Drink enough fluids to keep your pee (urine) clear or pale yellow.  Ask your doctor how to replace body fluid losses (rehydrate). Signs of body fluid loss (dehydration) include:  Feeling  very thirsty.  Dry lips and mouth.  Feeling dizzy.  Dark pee.  Peeing less than normal.  Feeling confused.  Fast breathing or heart rate. GET HELP RIGHT AWAY IF:   You have blood in your throw up.  You have black or bloody poop (stool).  You have a bad headache or stiff neck.  You feel confused.  You  have bad belly (abdominal) pain.  You have chest pain or trouble breathing.  You do not pee at least once every 8 hours.  You have cold, clammy skin.  You keep throwing up after 24 to 48 hours.  You have a fever. MAKE SURE YOU:   Understand these instructions.  Will watch your condition.  Will get help right away if you are not doing well or get worse.   This information is not intended to replace advice given to you by your health care provider. Make sure you discuss any questions you have with your health care provider.   Document Released: 08/19/2007 Document Revised: 05/25/2011 Document Reviewed: 08/01/2010 Elsevier Interactive Patient Education 2016 [employer]  Heart healthy diet. Activity as tolerated.

## 2015-07-21 NOTE — ED Notes (Signed)
Pt sleeping. 

## 2015-07-25 LAB — CULTURE, BLOOD (ROUTINE X 2)
Culture: NO GROWTH
Culture: NO GROWTH

## 2015-09-20 NOTE — Telephone Encounter (Signed)
error 

## 2016-03-25 ENCOUNTER — Encounter: Payer: Self-pay | Admitting: Emergency Medicine

## 2016-03-25 ENCOUNTER — Emergency Department
Admission: EM | Admit: 2016-03-25 | Discharge: 2016-03-25 | Disposition: A | Discharge status: Home / Self Care | Payer: BC Managed Care – PPO | Attending: Emergency Medicine | Admitting: Emergency Medicine

## 2016-03-25 ENCOUNTER — Emergency Department: Payer: BC Managed Care – PPO

## 2016-03-25 DIAGNOSIS — E039 Hypothyroidism, unspecified: Secondary | ICD-10-CM | POA: Insufficient documentation

## 2016-03-25 DIAGNOSIS — Z87891 Personal history of nicotine dependence: Secondary | ICD-10-CM | POA: Insufficient documentation

## 2016-03-25 DIAGNOSIS — Z7982 Long term (current) use of aspirin: Secondary | ICD-10-CM | POA: Insufficient documentation

## 2016-03-25 DIAGNOSIS — Z79899 Other long term (current) drug therapy: Secondary | ICD-10-CM | POA: Diagnosis not present

## 2016-03-25 DIAGNOSIS — R05 Cough: Secondary | ICD-10-CM | POA: Diagnosis present

## 2016-03-25 DIAGNOSIS — J40 Bronchitis, not specified as acute or chronic: Secondary | ICD-10-CM | POA: Diagnosis not present

## 2016-03-25 LAB — CBC
HCT: 42 % (ref 35.0–47.0)
HEMOGLOBIN: 14.6 g/dL (ref 12.0–16.0)
MCH: 29.8 pg (ref 26.0–34.0)
MCHC: 34.7 g/dL (ref 32.0–36.0)
MCV: 85.9 fL (ref 80.0–100.0)
Platelets: 165 10*3/uL (ref 150–440)
RBC: 4.89 MIL/uL (ref 3.80–5.20)
RDW: 13.7 % (ref 11.5–14.5)
WBC: 7.2 10*3/uL (ref 3.6–11.0)

## 2016-03-25 LAB — COMPREHENSIVE METABOLIC PANEL
ALK PHOS: 72 U/L (ref 38–126)
ALT: 32 U/L (ref 14–54)
AST: 28 U/L (ref 15–41)
Albumin: 4.5 g/dL (ref 3.5–5.0)
Anion gap: 9 (ref 5–15)
BUN: 6 mg/dL (ref 6–20)
CO2: 26 mmol/L (ref 22–32)
CREATININE: 0.55 mg/dL (ref 0.44–1.00)
Calcium: 8.8 mg/dL — ABNORMAL LOW (ref 8.9–10.3)
Chloride: 103 mmol/L (ref 101–111)
Glucose, Bld: 135 mg/dL — ABNORMAL HIGH (ref 65–99)
Potassium: 3.5 mmol/L (ref 3.5–5.1)
Sodium: 138 mmol/L (ref 135–145)
Total Bilirubin: 0.8 mg/dL (ref 0.3–1.2)
Total Protein: 7.8 g/dL (ref 6.5–8.1)

## 2016-03-25 LAB — RAPID INFLUENZA A&B ANTIGENS (ARMC ONLY)
INFLUENZA A (ARMC): NEGATIVE
INFLUENZA B (ARMC): NEGATIVE

## 2016-03-25 MED ORDER — ALBUTEROL SULFATE (2.5 MG/3ML) 0.083% IN NEBU: 5.0000 mg | INHALATION_SOLUTION | Freq: Once | RESPIRATORY_TRACT | Status: DC

## 2016-03-25 MED ORDER — PREDNISONE 20 MG PO TABS
60.0000 mg | ORAL_TABLET | Freq: Once | ORAL | Status: AC
  Administered 2016-03-25: 60 mg via ORAL
  Filled 2016-03-25: qty 3

## 2016-03-25 MED ORDER — IBUPROFEN 400 MG PO TABS
400.0000 mg | ORAL_TABLET | Freq: Once | ORAL | Status: DC
  Filled 2016-03-25: qty 1

## 2016-03-25 MED ORDER — IPRATROPIUM-ALBUTEROL 0.5-2.5 (3) MG/3ML IN SOLN
9.0000 mL | Freq: Once | RESPIRATORY_TRACT | Status: AC
  Administered 2016-03-25: 9 mL via RESPIRATORY_TRACT
  Filled 2016-03-25: qty 9

## 2016-03-25 MED ORDER — ONDANSETRON HCL 4 MG PO TABS
4.0000 mg | ORAL_TABLET | Freq: Once | ORAL | Status: AC
  Administered 2016-03-25: 4 mg via ORAL
  Filled 2016-03-25: qty 1

## 2016-03-25 MED ORDER — PREDNISONE 50 MG PO TABS: ORAL_TABLET | ORAL | 0 refills | Status: AC

## 2016-03-25 MED ORDER — ALBUTEROL SULFATE HFA 108 (90 BASE) MCG/ACT IN AERS: 2.0000 | INHALATION_SPRAY | Freq: Four times a day (QID) | RESPIRATORY_TRACT | 2 refills | Status: AC | PRN

## 2016-03-25 NOTE — ED Notes (Signed)
Pt pulled off VS cords for repeat VS before discharge. Pt alert and oriented X4, active, cooperative, pt in NAD. RR even and unlabored, color WNL.

## 2016-03-25 NOTE — ED Provider Notes (Signed)
[facility] Emergency Department Provider Note  ____________________________________________   First MD Initiated Contact with Patient 03/25/16 1406     (approximate)  I have reviewed the triage vital signs and the nursing notes.   HISTORY  Chief Complaint Cough and Fever   HPI Wendy Clarke is a 56 y.o. female  with a history of hyperlipidemia as well as hypothyroidism who is presenting to the emergency department today with cough, body aches, frontal headache and fever. She says that she feels mucus trickling down the back of her throat as well. 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 Says that she is also had several episodes of retching and has vomited twice and feels nauseous at this time. Says that she also feels pressure in both of her ears. Was seen at the [facility] clinic this past Monday and was given doxycycline as well a cough syrup. She says she is continuing to take the doxycycline but is switched from a cough syrup and Mucinex. She says that she started having fever today but it has been a subjective fever.   Past Medical History:  Diagnosis Date  . Hyperlipidemia   . Hypothyroidism   . Obesity     Patient Active Problem List   Diagnosis Date Noted  . SIRS (systemic inflammatory response syndrome) (HCC) 07/20/2015  . Dehydration 07/20/2015  . URI (upper respiratory infection) 07/20/2015  . Diarrhea 05/24/2014  . Fatigue 03/23/2014  . Arthralgia 03/23/2014  . Routine general medical examination at a health care facility 09/01/2013  . Obesity (BMI 30-39.9) 09/01/2013  . Anxiety 02/17/2013  . Hyperlipidemia 02/17/2013  . Osteoarthritis, knee 02/17/2013  . Hypothyroidism 11/03/2010    Past Surgical History:  Procedure Laterality Date  . ABDOMINAL HYSTERECTOMY     2003  . APPENDECTOMY    . braces    . ESOPHAGEAL DILATION     06/2015  . GANGLION CYST EXCISION      Prior to Admission medications   Medication  Sig Start Date End Date Taking? Authorizing Provider  aspirin EC 81 MG tablet Take 81 mg by mouth daily.    Historical Provider, MD  atorvastatin (LIPITOR) 20 MG tablet Take 20 mg by mouth daily at 6 PM.    Historical Provider, MD  levothyroxine (SYNTHROID, LEVOTHROID) 50 MCG tablet Take 1 tablet (50 mcg total) by mouth daily before breakfast. 04/10/15   Shelia Media, MD  ondansetron (ZOFRAN) 4 MG tablet Take 1 tablet (4 mg total) by mouth every 8 (eight) hours as needed for nausea or vomiting. 07/20/15   Governor Rooks, MD    Allergies Penicillins and Percocet [oxycodone-acetaminophen]  Family History  Problem Relation Age of Onset  . Heart disease Father   . Diabetes Father   . Diabetes Sister     Social History Social History  Substance Use Topics  . Smoking status: Former Smoker    Packs/day: 0.25    Types: Cigarettes  . Smokeless tobacco: Never Used  . Alcohol use No    Review of Systems Constitutional: As above Eyes: No visual changes. ENT: No sore throat Cardiovascular: Denies chest pain. Respiratory:As above Gastrointestinal: No abdominal pain.  No nausea, no vomiting.   Genitourinary: Negative for dysuria. Musculoskeletal: Negative for back pain. Skin: Negative for rash. Neurological: Negative for focal weakness or numbness.  10-point ROS otherwise negative.  ____________________________________________   PHYSICAL EXAM:  VITAL SIGNS: ED Triage Vitals  Enc Vitals Group     BP 03/25/16  1051 137/67     Pulse Rate 03/25/16 1051 (!) 109     Resp 03/25/16 1051 20     Temp 03/25/16 1051 100 F (37.8 C)     Temp Source 03/25/16 1051 Oral     SpO2 03/25/16 1051 95 %     Weight 03/25/16 1052 210 lb (95.3 kg)     Height 03/25/16 1052 5\' 2"  (1.575 m)     Head Circumference --      Peak Flow --      Pain Score 03/25/16 1052 9     Pain Loc --      Pain Edu? --      Excl. in GC? --     Constitutional: Alert and oriented. Well appearing and in no acute  distress. Eyes: Conjunctivae are normal. PERRL. EOMI. Head: Atraumatic.Left-sided TM is normal right-sided TM with mild erythema as well as mild amount of bulging. Nose: No congestion/rhinnorhea. Mouth/Throat: Mucous membranes are moist.  Oropharynx non-erythematous. Mucus able to be seen dripping in the posterior pharynx. Neck: No stridor.   Cardiovascular: Normal rate, regular rhythm. Grossly normal heart sounds.  Good peripheral circulation. Respiratory: Normal respiratory effort.  No retractions. Prolonged expiratory phase with scant wheezing at the bases, bilaterally. Gastrointestinal: Soft and nontender. No distention. No abdominal bruits. No CVA tenderness. Musculoskeletal: No lower extremity tenderness nor edema.  No joint effusions. Neurologic:  Normal speech and language. No gross focal neurologic deficits are appreciated.  Skin:  Skin is warm, dry and intact. No rash noted. Psychiatric: Mood and affect are normal. Speech and behavior are normal.  ____________________________________________   LABS (all labs ordered are listed, but only abnormal results are displayed)  Labs Reviewed  COMPREHENSIVE METABOLIC PANEL - Abnormal; Notable for the following:       Result Value   Glucose, Bld 135 (*)    Calcium 8.8 (*)    All other components within normal limits  RAPID INFLUENZA A&B ANTIGENS ([facility] ONLY)  CBC   ____________________________________________  EKG  ____________________________________________  RADIOLOGY    DG Chest 2 View (Final result)  Result time 03/25/16 11:40:28  Final result by Jonothan Heberle A [place], MD (03/25/16 11:40:28)           Narrative:   CLINICAL DATA: Three days of cough and weakness with symptoms of bronchitis which are progressive. Patient reports vomiting as well.  EXAM: CHEST 2 VIEW  COMPARISON: Chest x-ray dated Jul 20, 2015  FINDINGS: The lungs are adequately inflated. There is subtle interstitial density present in the left lower  lobe. There is no alveolar infiltrate. There is no pleural effusion. The heart and pulmonary vascularity are normal. The mediastinum is normal in width. The trachea is midline. The bony thorax is unremarkable.  IMPRESSION: Minimal subsegmental atelectasis in the left lower lobe. No alveolar pneumonia nor CHF.   Electronically Signed By: Marieann Zipp [place] M.D. On: 03/25/2016 11:40          ____________________________________________   PROCEDURES  Procedure(s) performed:   Procedures  Critical Care performed:   ____________________________________________   INITIAL IMPRESSION / ASSESSMENT AND PLAN / ED COURSE  Pertinent labs & imaging results that were available during my care of the patient were reviewed by me and considered in my medical decision making (see chart for details).    Clinical Course    ----------------------------------------- 3:41 PM on 03/25/2016 -----------------------------------------  Patient feeling improved at this time. Reasked the lungs and she is without any wheezing. She'll be discharged home with steroids.  She'll continue her doxycycline. She also be discharged home with a prescription for Zofran. She is understanding of the plan and willing to comply. Will be following up with her primary care doctor. Headache likely related to systemic illness. Unlikely meningitis. Patient with normal mental status. Dr. Tish Men of neck pain.  ____________________________________________   FINAL CLINICAL IMPRESSION(S) / ED DIAGNOSES  Bronchitis.    NEW MEDICATIONS STARTED DURING THIS VISIT:  New Prescriptions   No medications on file     Note:  This document was prepared using Dragon voice recognition software and may include unintentional dictation errors.    Myrna Blazer, MD 03/25/16 [phone]

## 2016-03-25 NOTE — ED Notes (Signed)
Flu swab done

## 2016-03-25 NOTE — ED Triage Notes (Signed)
Pt reports [facility] and told she had bronchitis. Pt reports now with fever in addition to her cough. Pt reports tired. Cough non-productive

## 2016-11-28 IMAGING — US US BREAST LTD UNI LEFT INC AXILLA
1 series · 1 of 1 positions shown · non-contrast
Comparison: Priors

CLINICAL DATA: Followup left breast mass 9 o'clock location

EXAM:
DIGITAL DIAGNOSTIC left MAMMOGRAM WITH 3D TOMOSYNTHESIS WITH CAD
ULTRASOUND left BREAST

[Series 1: us breast ltd uni left inc axilla · 0.08mm/px · 1 of 1 slices shown]
[im 1/1]
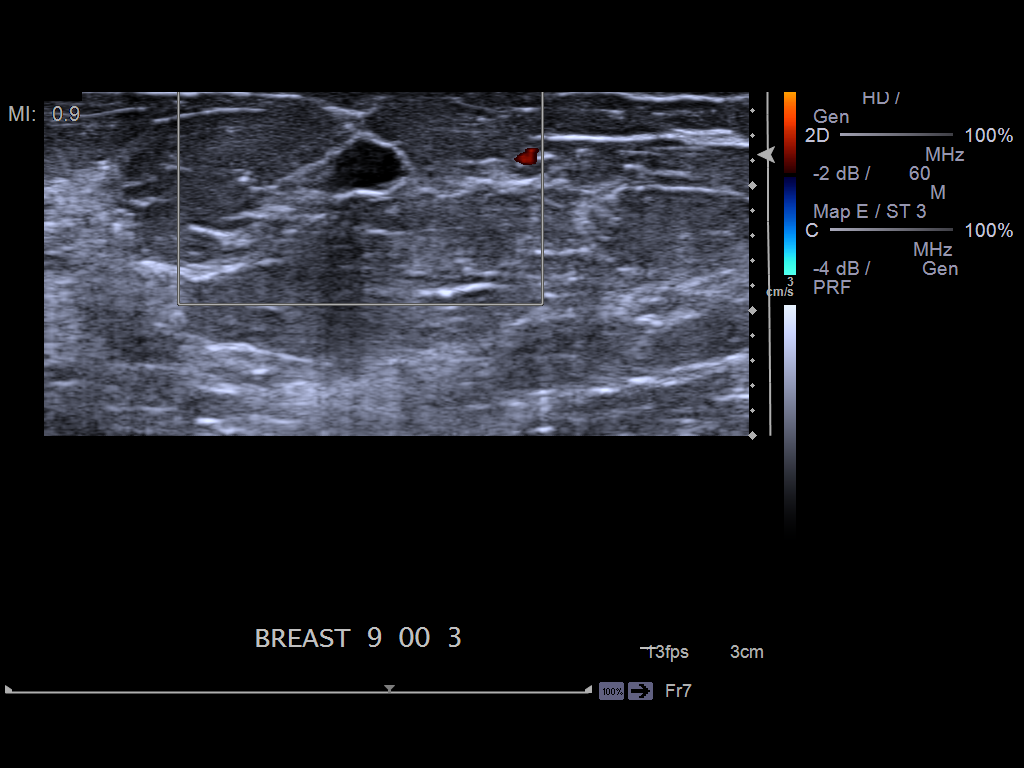

[1 of 1 positions shown; findings below may reference images not displayed]

ACR Breast Density Category c: The breast tissue is heterogeneously
dense, which may obscure small masses.
FINDINGS: The breast parenchymal pattern is heterogeneously dense and nodular,
which may limit the sensitivity of mammography. A 1 cm mass left
breast 9 o'clock location is reidentified. No new evidence for
malignancy is seen in the left breast.

Mammographic images were processed with CAD.

Targeted ultrasound is performed, showing an oval circumscribed
nearly anechoic mass left breast 9 o'clock location 3 cm from the
nipple measuring 6 x 5 x 4 mm, not significantly changed. Minimal
internal echoes are identified.
IMPRESSION: Stable probable complicated cyst, left breast 9 o'clock location. No
new evidence for malignancy in the left breast.

RECOMMENDATION:
Bilateral diagnostic mammography January 2015 and possibly left
breast ultrasound

I have discussed the findings and recommendations with the patient.
Results were also provided in writing at the conclusion of the
visit. If applicable, a reminder letter will be sent to the patient
regarding the next appointment.

BI-RADS CATEGORY  3: Probably benign.

## 2018-10-25 LAB — HM MAMMOGRAPHY

## 2022-06-23 ENCOUNTER — Ambulatory Visit: Payer: BC Managed Care – PPO

## 2022-06-23 DIAGNOSIS — K640 First degree hemorrhoids: Secondary | ICD-10-CM

## 2022-06-23 DIAGNOSIS — K621 Rectal polyp: Secondary | ICD-10-CM

## 2022-06-23 DIAGNOSIS — Z1211 Encounter for screening for malignant neoplasm of colon: Secondary | ICD-10-CM

## 2022-11-11 ENCOUNTER — Other Ambulatory Visit: Payer: Self-pay | Admitting: Sports Medicine

## 2022-11-11 DIAGNOSIS — M47816 Spondylosis without myelopathy or radiculopathy, lumbar region: Secondary | ICD-10-CM

## 2022-11-11 DIAGNOSIS — G8929 Other chronic pain: Secondary | ICD-10-CM

## 2022-11-11 DIAGNOSIS — M5136 Other intervertebral disc degeneration, lumbar region: Secondary | ICD-10-CM

## 2022-11-11 DIAGNOSIS — M5416 Radiculopathy, lumbar region: Secondary | ICD-10-CM

## 2022-11-23 ENCOUNTER — Encounter: Payer: Self-pay | Admitting: Sports Medicine

## 2022-11-24 ENCOUNTER — Ambulatory Visit
Admission: RE | Admit: 2022-11-24 | Discharge: 2022-11-24 | Disposition: A | Discharge status: Home / Self Care | Payer: BC Managed Care – PPO | Source: Ambulatory Visit | Attending: Sports Medicine

## 2022-11-24 DIAGNOSIS — M5136 Other intervertebral disc degeneration, lumbar region: Secondary | ICD-10-CM

## 2022-11-24 DIAGNOSIS — M5416 Radiculopathy, lumbar region: Secondary | ICD-10-CM

## 2022-11-24 DIAGNOSIS — G8929 Other chronic pain: Secondary | ICD-10-CM

## 2022-11-24 DIAGNOSIS — M47816 Spondylosis without myelopathy or radiculopathy, lumbar region: Secondary | ICD-10-CM

## 2023-01-01 ENCOUNTER — Encounter: Payer: Self-pay | Admitting: Family Medicine

## 2023-01-01 ENCOUNTER — Other Ambulatory Visit: Payer: Self-pay | Admitting: Family Medicine

## 2023-01-01 DIAGNOSIS — R2242 Localized swelling, mass and lump, left lower limb: Secondary | ICD-10-CM

## 2023-01-05 ENCOUNTER — Ambulatory Visit
Admission: RE | Admit: 2023-01-05 | Discharge: 2023-01-05 | Disposition: A | Discharge status: Home / Self Care | Payer: BC Managed Care – PPO | Source: Ambulatory Visit | Attending: Family Medicine | Admitting: Family Medicine

## 2023-01-05 DIAGNOSIS — R2242 Localized swelling, mass and lump, left lower limb: Secondary | ICD-10-CM | POA: Insufficient documentation

## 2023-01-18 ENCOUNTER — Other Ambulatory Visit: Payer: Self-pay | Admitting: Family Medicine

## 2023-01-18 DIAGNOSIS — Z1231 Encounter for screening mammogram for malignant neoplasm of breast: Secondary | ICD-10-CM

## 2023-02-02 ENCOUNTER — Ambulatory Visit
Admission: RE | Admit: 2023-02-02 | Discharge: 2023-02-02 | Disposition: A | Discharge status: Home / Self Care | Payer: BC Managed Care – PPO | Source: Ambulatory Visit | Attending: Family Medicine | Admitting: Family Medicine

## 2023-02-02 DIAGNOSIS — Z1231 Encounter for screening mammogram for malignant neoplasm of breast: Secondary | ICD-10-CM | POA: Insufficient documentation

## 2023-02-09 ENCOUNTER — Inpatient Hospital Stay
Admission: RE | Admit: 2023-02-09 | Discharge: 2023-02-09 | Disposition: A | Discharge status: Home / Self Care | Payer: Self-pay | Source: Ambulatory Visit | Attending: Sports Medicine | Admitting: Sports Medicine

## 2023-02-09 ENCOUNTER — Other Ambulatory Visit: Payer: Self-pay | Admitting: *Deleted

## 2023-02-09 DIAGNOSIS — Z1231 Encounter for screening mammogram for malignant neoplasm of breast: Secondary | ICD-10-CM

## 2023-02-15 ENCOUNTER — Inpatient Hospital Stay
Admission: RE | Admit: 2023-02-15 | Discharge: 2023-02-15 | Disposition: A | Discharge status: Home / Self Care | Payer: Self-pay | Source: Ambulatory Visit | Attending: Family Medicine | Admitting: Family Medicine

## 2023-02-15 ENCOUNTER — Other Ambulatory Visit: Payer: Self-pay | Admitting: *Deleted

## 2023-02-15 DIAGNOSIS — Z1231 Encounter for screening mammogram for malignant neoplasm of breast: Secondary | ICD-10-CM

## 2023-07-08 ENCOUNTER — Ambulatory Visit (INDEPENDENT_AMBULATORY_CARE_PROVIDER_SITE_OTHER)

## 2023-07-08 ENCOUNTER — Encounter: Payer: Self-pay | Admitting: Emergency Medicine

## 2023-07-08 ENCOUNTER — Ambulatory Visit: Admission: EM | Admit: 2023-07-08 | Discharge: 2023-07-08 | Disposition: A | Discharge status: Home / Self Care

## 2023-07-08 DIAGNOSIS — S20212A Contusion of left front wall of thorax, initial encounter: Secondary | ICD-10-CM | POA: Diagnosis not present

## 2023-07-08 DIAGNOSIS — S301XXA Contusion of abdominal wall, initial encounter: Secondary | ICD-10-CM | POA: Diagnosis not present

## 2023-07-08 DIAGNOSIS — R0781 Pleurodynia: Secondary | ICD-10-CM | POA: Diagnosis not present

## 2023-07-08 MED ORDER — HYDROCODONE-ACETAMINOPHEN 5-325 MG PO TABS: 1.0000 | ORAL_TABLET | Freq: Four times a day (QID) | ORAL | 0 refills | Status: AC | PRN

## 2023-07-08 MED ORDER — ONDANSETRON 8 MG PO TBDP: 8.0000 mg | ORAL_TABLET | Freq: Three times a day (TID) | ORAL | 0 refills | Status: AC | PRN

## 2023-07-08 NOTE — ED Triage Notes (Signed)
 Pt tripped over a baby gate at home and fell. She hit her left side against the door lever and fell face first on the ground. Pt denies hitting her face or LOC.

## 2023-07-08 NOTE — ED Provider Notes (Addendum)
 [facility]    CSN: [id number] Arrival date & time: 07/08/23  0930      History   Chief Complaint Chief Complaint  Patient presents with   Fall    HPI Wendy Clarke is a 63 y.o. female.   HPI  63 year old female with past medical history significant for hyperlipidemia, hypothyroidism, obesity, and anxiety presents for evaluation of pain on the left side of her chest wall after suffering a ground-level fall 4 days ago.  She reports that she tripped over a baby gate and as she was falling she struck the door handle with her left posterior chest wall.  She did fall face first but she denies striking her head or having a loss of consciousness.  She was able to get herself up after resting on the ground for a few minutes.  She states that the pain is constant but occasionally will increase and she describes it as a grabbing and twisting sensation.  No shortness of breath.  She has been using 800 mg of ibuprofen  without any improvement of symptoms.  Movement, deep breathing, and cough all increased pain.  Past Medical History:  Diagnosis Date   Hyperlipidemia    Hypothyroidism    Obesity     Patient Active Problem List   Diagnosis Date Noted   SIRS (systemic inflammatory response syndrome) (HCC) 07/20/2015   Dehydration 07/20/2015   URI (upper respiratory infection) 07/20/2015   Diarrhea 05/24/2014   Fatigue 03/23/2014   Arthralgia 03/23/2014   Routine general medical examination at a health care facility 09/01/2013   Obesity (BMI 30-39.9) 09/01/2013   Anxiety 02/17/2013   Hyperlipidemia 02/17/2013   Osteoarthritis, knee 02/17/2013   Hypothyroidism 11/03/2010    Past Surgical History:  Procedure Laterality Date   ABDOMINAL HYSTERECTOMY     2003   APPENDECTOMY     braces     ESOPHAGEAL DILATION     06/2015   GANGLION CYST EXCISION      OB History   No obstetric history on file.      Home Medications    Prior to Admission medications   Medication  Sig Start Date End Date Taking? Authorizing Provider  atorvastatin  (LIPITOR) 20 MG tablet Take 20 mg by mouth daily at 6 PM.   Yes [provider]  HYDROcodone -acetaminophen  (NORCO/VICODIN) 5-325 MG tablet Take 1 tablet by mouth every 6 (six) hours as needed. 07/08/23  Yes Kent Pear, NP  levothyroxine  (SYNTHROID , LEVOTHROID) 50 MCG tablet Take 1 tablet (50 mcg total) by mouth daily before breakfast. 04/10/15  Yes Thomes Flicker, MD  ondansetron  (ZOFRAN -ODT) 8 MG disintegrating tablet Take 1 tablet (8 mg total) by mouth every 8 (eight) hours as needed for nausea or vomiting. 07/08/23  Yes Kent Pear, NP  albuterol  (PROVENTIL  HFA;VENTOLIN  HFA) 108 (90 Base) MCG/ACT inhaler Inhale 2 puffs into the lungs every 6 (six) hours as needed for wheezing or shortness of breath. 03/25/16   Christiane Cowing, MD  aspirin  EC 81 MG tablet Take 81 mg by mouth daily.    [provider]  MOUNJARO 7.5 MG/0.5ML Pen SMARTSIG:0.5 Milliliter(s) SUB-Q Once a Week    [provider]  predniSONE  (DELTASONE ) 50 MG tablet Take 1 tab, PO daily x5 days 03/25/16   Christiane Cowing, MD  traZODone (DESYREL) 50 MG tablet Take 50 mg by mouth at bedtime.    [provider]  Vitamin D , Ergocalciferol , (DRISDOL) 1.25 MG (50000 UNIT) CAPS capsule Take 50,000 Units by  mouth once a week.    [provider]    Family History Family History  Problem Relation Age of Onset   Heart disease Father    Diabetes Father    Diabetes Sister     Social History Social History   Tobacco Use   Smoking status: Former    Current packs/day: 0.25    Types: Cigarettes   Smokeless tobacco: Never  Vaping Use   Vaping status: Never Used  Substance Use Topics   Alcohol use: No   Drug use: No     Allergies   Penicillins, Percocet [oxycodone-acetaminophen ], Ciprofloxacin, and Etodolac   Review of Systems Review of Systems  Respiratory:  Negative for shortness of breath.    Cardiovascular:  Positive for chest pain.       Pain in left ribs.  Skin:  Positive for color change.     Physical Exam Triage Vital Signs ED Triage Vitals  Encounter Vitals Group     BP      Systolic BP Percentile      Diastolic BP Percentile      Pulse      Resp      Temp      Temp src      SpO2      Weight      Height      Head Circumference      Peak Flow      Pain Score      Pain Loc      Pain Education      Exclude from Growth Chart    No data found.  Updated Vital Signs BP 119/80 (BP Location: Right Arm)   Pulse 82   Temp 98.3 F (36.8 C) (Oral)   Resp 18   SpO2 96%   Visual Acuity Right Eye Distance:   Left Eye Distance:   Bilateral Distance:    Right Eye Near:   Left Eye Near:    Bilateral Near:     Physical Exam Vitals and nursing note reviewed.  Constitutional:      Appearance: Normal appearance. She is not ill-appearing.  HENT:     Head: Normocephalic and atraumatic.  Cardiovascular:     Rate and Rhythm: Normal rate and regular rhythm.     Pulses: Normal pulses.     Heart sounds: Normal heart sounds. No murmur heard.    No friction rub. No gallop.  Pulmonary:     Effort: Pulmonary effort is normal.     Breath sounds: Normal breath sounds. No wheezing, rhonchi or rales.     Comments: Pain in the left lower half of the rib cage.  No appreciable crepitus. Chest:     Chest wall: Tenderness present.  Abdominal:     Palpations: Abdomen is soft.     Tenderness: There is abdominal tenderness. There is no guarding or rebound.  Skin:    General: Skin is warm and dry.     Capillary Refill: Capillary refill takes less than 2 seconds.     Findings: Bruising present.  Neurological:     General: No focal deficit present.     Mental Status: She is alert and oriented to person, place, and time.      UC Treatments / Results  Labs (all labs ordered are listed, but only abnormal results are displayed) Labs Reviewed - No data to  display  EKG   Radiology No results found.  Procedures Procedures (including critical care time)  Medications  Ordered in UC Medications - No data to display  Initial Impression / Assessment and Plan / UC Course  I have reviewed the triage vital signs and the nursing notes.  Pertinent labs & imaging results that were available during my care of the patient were reviewed by me and considered in my medical decision making (see chart for details).   Patient is a pleasant, nontoxic-appearing 63 year old female presenting for evaluation of pain on the left side of her chest wall and also left upper abdomen after suffering a ground-level fall 4 days ago as outlined in HPI above.  As you can see in image above, there is a large bruise just above the patient's bra line on the left posterior lateral rib cage this area is tender to palpation without appreciable crepitus.  The pain radiates along the length of the 4th and 5th rib anteriorly, again without appreciable crepitus.  Patient is also complaining of tenderness in the left upper quadrant of her abdomen.  She impacted the door with the posterior aspect of her thorax and then landed on the ground face first.  I do not suspect deeper abdominal injury but I will obtain a radiograph of the left rib cage to rule out any rib fractures.  Left rib series independently reviewed and evaluated by me.  Impression: No evidence of acute rib fracture or pneumothorax.  Radiology overread is pending. Radiology impression states negative for fracture.  I will discharge patient home with diagnosis of left-sided chest wall contusion and abdominal contusion.  I will have her continue to use over-the-counter Tylenol  and/or ibuprofen  as needed for mild to moderate pain and I will give her a short prescription of Norco for more severe pain.  She has no open scripts in PDMP.  She does experience nausea and vomiting with Percocet so I will prescribe Zofran  that she can  have on hand in case the pain medication makes her nauseated.   Final Clinical Impressions(s) / UC Diagnoses   Final diagnoses:  Rib pain on left side  Chest wall contusion, left, initial encounter  Contusion of abdominal wall, initial encounter     Discharge Instructions      Your rib x-rays did not show any evidence of fracture that I could see.  The radiology reading is still pending.  I suspect that you have bruised your chest wall as well as your upper abdominal wall and this will take time to heal.  Continue to use over-the-counter Tylenol  and/or ibuprofen  according the package instructions as needed for mild to moderate pain.  I have prescribed Norco for you that she may use for more severe pain.  You may take 1 tablet every 6 hours as needed.  Be mindful this medication will make you drowsy so do not drink alcohol or drive if you take it.  I have also prescribe Zofran  8 mg ODT tablets that you can take every 8 hours as needed for nausea and vomiting if the pain medication makes you nauseated.  If you develop any new or worsening symptoms either return for reevaluation or follow-up with your primary care provider.     ED Prescriptions     Medication Sig Dispense Auth. Provider   HYDROcodone -acetaminophen  (NORCO/VICODIN) 5-325 MG tablet Take 1 tablet by mouth every 6 (six) hours as needed. 10 tablet Kent Pear, NP   ondansetron  (ZOFRAN -ODT) 8 MG disintegrating tablet Take 1 tablet (8 mg total) by mouth every 8 (eight) hours as needed for nausea or vomiting. 20 tablet  Kent Pear, NP      I have reviewed the PDMP during this encounter.   Kent Pear, NP 07/08/23 1201    Kent Pear, NP 07/08/23 1539

## 2023-07-08 NOTE — Discharge Instructions (Addendum)
 Your rib x-rays did not show any evidence of fracture that I could see.  The radiology reading is still pending.  I suspect that you have bruised your chest wall as well as your upper abdominal wall and this will take time to heal.  Continue to use over-the-counter Tylenol  and/or ibuprofen  according the package instructions as needed for mild to moderate pain.  I have prescribed Norco for you that she may use for more severe pain.  You may take 1 tablet every 6 hours as needed.  Be mindful this medication will make you drowsy so do not drink alcohol or drive if you take it.  I have also prescribe Zofran  8 mg ODT tablets that you can take every 8 hours as needed for nausea and vomiting if the pain medication makes you nauseated.  If you develop any new or worsening symptoms either return for reevaluation or follow-up with your primary care provider.

## 2024-03-17 ENCOUNTER — Other Ambulatory Visit: Payer: Self-pay | Admitting: Family Medicine

## 2024-03-17 DIAGNOSIS — Z1231 Encounter for screening mammogram for malignant neoplasm of breast: Secondary | ICD-10-CM

## 2024-04-12 ENCOUNTER — Ambulatory Visit

## 2024-04-19 ENCOUNTER — Encounter

## 2024-05-16 ENCOUNTER — Ambulatory Visit
